# Patient Record
Sex: Female | Born: 1995 | Race: White | Hispanic: No | Marital: Single | Smoking: Never smoker
Health system: Southern US, Community
[De-identification: ages and names within clinical notes are randomized; demographics above are authoritative.]

## PROBLEM LIST (undated history)

## (undated) DIAGNOSIS — J069 Acute upper respiratory infection, unspecified: Secondary | ICD-10-CM

## (undated) DIAGNOSIS — F32A Depression, unspecified: Secondary | ICD-10-CM

## (undated) DIAGNOSIS — J45909 Unspecified asthma, uncomplicated: Secondary | ICD-10-CM

## (undated) DIAGNOSIS — K219 Gastro-esophageal reflux disease without esophagitis: Secondary | ICD-10-CM

## (undated) DIAGNOSIS — R51 Headache: Secondary | ICD-10-CM

## (undated) DIAGNOSIS — L509 Urticaria, unspecified: Secondary | ICD-10-CM

## (undated) DIAGNOSIS — D849 Immunodeficiency, unspecified: Secondary | ICD-10-CM

## (undated) DIAGNOSIS — D8489 Other immunodeficiencies: Secondary | ICD-10-CM

## (undated) DIAGNOSIS — J42 Unspecified chronic bronchitis: Secondary | ICD-10-CM

## (undated) DIAGNOSIS — T7840XA Allergy, unspecified, initial encounter: Secondary | ICD-10-CM

## (undated) DIAGNOSIS — R519 Headache, unspecified: Secondary | ICD-10-CM

## (undated) DIAGNOSIS — F329 Major depressive disorder, single episode, unspecified: Secondary | ICD-10-CM

## (undated) HISTORY — DX: Unspecified chronic bronchitis: J42

## (undated) HISTORY — DX: Headache, unspecified: R51.9

## (undated) HISTORY — DX: Gastro-esophageal reflux disease without esophagitis: K21.9

## (undated) HISTORY — DX: Allergy, unspecified, initial encounter: T78.40XA

## (undated) HISTORY — PX: FOOT SURGERY: SHX648

## (undated) HISTORY — DX: Urticaria, unspecified: L50.9

## (undated) HISTORY — DX: Headache: R51

## (undated) HISTORY — DX: Depression, unspecified: F32.A

## (undated) HISTORY — DX: Acute upper respiratory infection, unspecified: J06.9

## (undated) HISTORY — DX: Unspecified asthma, uncomplicated: J45.909

## (undated) HISTORY — PX: HIP SURGERY: SHX245

## (undated) HISTORY — DX: Major depressive disorder, single episode, unspecified: F32.9

---

## 2017-07-29 DIAGNOSIS — J45909 Unspecified asthma, uncomplicated: Secondary | ICD-10-CM | POA: Diagnosis not present

## 2017-07-29 DIAGNOSIS — D806 Antibody deficiency with near-normal immunoglobulins or with hyperimmunoglobulinemia: Secondary | ICD-10-CM | POA: Diagnosis not present

## 2017-07-29 DIAGNOSIS — D803 Selective deficiency of immunoglobulin G [IgG] subclasses: Secondary | ICD-10-CM | POA: Diagnosis not present

## 2017-08-05 DIAGNOSIS — J101 Influenza due to other identified influenza virus with other respiratory manifestations: Secondary | ICD-10-CM | POA: Diagnosis not present

## 2017-08-05 DIAGNOSIS — J09X2 Influenza due to identified novel influenza A virus with other respiratory manifestations: Secondary | ICD-10-CM | POA: Diagnosis not present

## 2017-08-05 DIAGNOSIS — J45909 Unspecified asthma, uncomplicated: Secondary | ICD-10-CM | POA: Diagnosis not present

## 2017-08-05 DIAGNOSIS — R05 Cough: Secondary | ICD-10-CM | POA: Diagnosis not present

## 2017-08-05 DIAGNOSIS — G43909 Migraine, unspecified, not intractable, without status migrainosus: Secondary | ICD-10-CM | POA: Diagnosis not present

## 2017-08-05 DIAGNOSIS — R0602 Shortness of breath: Secondary | ICD-10-CM | POA: Diagnosis not present

## 2017-09-04 DIAGNOSIS — D806 Antibody deficiency with near-normal immunoglobulins or with hyperimmunoglobulinemia: Secondary | ICD-10-CM | POA: Diagnosis not present

## 2017-09-04 DIAGNOSIS — D803 Selective deficiency of immunoglobulin G [IgG] subclasses: Secondary | ICD-10-CM | POA: Diagnosis not present

## 2017-09-11 DIAGNOSIS — D806 Antibody deficiency with near-normal immunoglobulins or with hyperimmunoglobulinemia: Secondary | ICD-10-CM | POA: Diagnosis not present

## 2017-09-11 DIAGNOSIS — D803 Selective deficiency of immunoglobulin G [IgG] subclasses: Secondary | ICD-10-CM | POA: Diagnosis not present

## 2017-10-01 DIAGNOSIS — M6281 Muscle weakness (generalized): Secondary | ICD-10-CM | POA: Diagnosis not present

## 2017-10-01 DIAGNOSIS — M25651 Stiffness of right hip, not elsewhere classified: Secondary | ICD-10-CM | POA: Diagnosis not present

## 2017-10-01 DIAGNOSIS — M25551 Pain in right hip: Secondary | ICD-10-CM | POA: Diagnosis not present

## 2017-10-20 DIAGNOSIS — M6281 Muscle weakness (generalized): Secondary | ICD-10-CM | POA: Diagnosis not present

## 2017-10-20 DIAGNOSIS — M25551 Pain in right hip: Secondary | ICD-10-CM | POA: Diagnosis not present

## 2017-10-20 DIAGNOSIS — M25651 Stiffness of right hip, not elsewhere classified: Secondary | ICD-10-CM | POA: Diagnosis not present

## 2017-10-27 DIAGNOSIS — M25551 Pain in right hip: Secondary | ICD-10-CM | POA: Diagnosis not present

## 2017-10-27 DIAGNOSIS — M6281 Muscle weakness (generalized): Secondary | ICD-10-CM | POA: Diagnosis not present

## 2017-10-27 DIAGNOSIS — M25651 Stiffness of right hip, not elsewhere classified: Secondary | ICD-10-CM | POA: Diagnosis not present

## 2017-10-29 DIAGNOSIS — M25551 Pain in right hip: Secondary | ICD-10-CM | POA: Diagnosis not present

## 2017-10-29 DIAGNOSIS — M6281 Muscle weakness (generalized): Secondary | ICD-10-CM | POA: Diagnosis not present

## 2017-10-29 DIAGNOSIS — M25651 Stiffness of right hip, not elsewhere classified: Secondary | ICD-10-CM | POA: Diagnosis not present

## 2017-11-05 DIAGNOSIS — M25551 Pain in right hip: Secondary | ICD-10-CM | POA: Diagnosis not present

## 2017-11-05 DIAGNOSIS — M25552 Pain in left hip: Secondary | ICD-10-CM | POA: Diagnosis not present

## 2017-11-11 DIAGNOSIS — D803 Selective deficiency of immunoglobulin G [IgG] subclasses: Secondary | ICD-10-CM | POA: Diagnosis not present

## 2017-11-11 DIAGNOSIS — D806 Antibody deficiency with near-normal immunoglobulins or with hyperimmunoglobulinemia: Secondary | ICD-10-CM | POA: Diagnosis not present

## 2017-12-01 DIAGNOSIS — R87610 Atypical squamous cells of undetermined significance on cytologic smear of cervix (ASC-US): Secondary | ICD-10-CM | POA: Diagnosis not present

## 2017-12-01 DIAGNOSIS — Z01419 Encounter for gynecological examination (general) (routine) without abnormal findings: Secondary | ICD-10-CM | POA: Diagnosis not present

## 2017-12-01 DIAGNOSIS — Z30015 Encounter for initial prescription of vaginal ring hormonal contraceptive: Secondary | ICD-10-CM | POA: Diagnosis not present

## 2017-12-18 DIAGNOSIS — B349 Viral infection, unspecified: Secondary | ICD-10-CM | POA: Diagnosis not present

## 2017-12-30 DIAGNOSIS — M25551 Pain in right hip: Secondary | ICD-10-CM | POA: Diagnosis not present

## 2017-12-30 DIAGNOSIS — M25552 Pain in left hip: Secondary | ICD-10-CM | POA: Diagnosis not present

## 2018-01-11 DIAGNOSIS — D806 Antibody deficiency with near-normal immunoglobulins or with hyperimmunoglobulinemia: Secondary | ICD-10-CM | POA: Diagnosis not present

## 2018-01-11 DIAGNOSIS — D803 Selective deficiency of immunoglobulin G [IgG] subclasses: Secondary | ICD-10-CM | POA: Diagnosis not present

## 2018-01-15 DIAGNOSIS — L500 Allergic urticaria: Secondary | ICD-10-CM | POA: Diagnosis not present

## 2018-01-27 DIAGNOSIS — J019 Acute sinusitis, unspecified: Secondary | ICD-10-CM | POA: Diagnosis not present

## 2018-01-29 DIAGNOSIS — M25552 Pain in left hip: Secondary | ICD-10-CM | POA: Diagnosis not present

## 2018-01-29 DIAGNOSIS — M25551 Pain in right hip: Secondary | ICD-10-CM | POA: Diagnosis not present

## 2018-01-29 DIAGNOSIS — M25851 Other specified joint disorders, right hip: Secondary | ICD-10-CM | POA: Diagnosis not present

## 2018-02-02 DIAGNOSIS — Z9889 Other specified postprocedural states: Secondary | ICD-10-CM | POA: Diagnosis not present

## 2018-02-02 DIAGNOSIS — M25551 Pain in right hip: Secondary | ICD-10-CM | POA: Diagnosis not present

## 2018-02-12 DIAGNOSIS — M25852 Other specified joint disorders, left hip: Secondary | ICD-10-CM | POA: Diagnosis not present

## 2018-02-12 DIAGNOSIS — M25851 Other specified joint disorders, right hip: Secondary | ICD-10-CM | POA: Diagnosis not present

## 2018-02-12 DIAGNOSIS — Z9889 Other specified postprocedural states: Secondary | ICD-10-CM | POA: Diagnosis not present

## 2018-02-16 DIAGNOSIS — Z3202 Encounter for pregnancy test, result negative: Secondary | ICD-10-CM | POA: Diagnosis not present

## 2018-03-09 DIAGNOSIS — N760 Acute vaginitis: Secondary | ICD-10-CM | POA: Diagnosis not present

## 2018-05-05 ENCOUNTER — Encounter (HOSPITAL_COMMUNITY): Payer: Self-pay | Admitting: Emergency Medicine

## 2018-05-05 ENCOUNTER — Inpatient Hospital Stay (HOSPITAL_COMMUNITY)
Admission: EM | Admit: 2018-05-05 | Discharge: 2018-05-10 | DRG: 098 | Disposition: A | Discharge status: Home / Self Care | Payer: 59 | Attending: Internal Medicine | Admitting: Internal Medicine

## 2018-05-05 ENCOUNTER — Other Ambulatory Visit: Payer: Self-pay

## 2018-05-05 DIAGNOSIS — I959 Hypotension, unspecified: Secondary | ICD-10-CM | POA: Diagnosis not present

## 2018-05-05 DIAGNOSIS — R519 Headache, unspecified: Secondary | ICD-10-CM

## 2018-05-05 DIAGNOSIS — D849 Immunodeficiency, unspecified: Secondary | ICD-10-CM | POA: Diagnosis not present

## 2018-05-05 DIAGNOSIS — D800 Hereditary hypogammaglobulinemia: Secondary | ICD-10-CM | POA: Diagnosis present

## 2018-05-05 DIAGNOSIS — R6889 Other general symptoms and signs: Secondary | ICD-10-CM

## 2018-05-05 DIAGNOSIS — Z833 Family history of diabetes mellitus: Secondary | ICD-10-CM

## 2018-05-05 DIAGNOSIS — R51 Headache: Secondary | ICD-10-CM | POA: Diagnosis not present

## 2018-05-05 DIAGNOSIS — Z8249 Family history of ischemic heart disease and other diseases of the circulatory system: Secondary | ICD-10-CM

## 2018-05-05 DIAGNOSIS — G030 Nonpyogenic meningitis: Secondary | ICD-10-CM | POA: Diagnosis not present

## 2018-05-05 DIAGNOSIS — D8489 Other immunodeficiencies: Secondary | ICD-10-CM

## 2018-05-05 DIAGNOSIS — M25551 Pain in right hip: Secondary | ICD-10-CM | POA: Diagnosis present

## 2018-05-05 DIAGNOSIS — Z23 Encounter for immunization: Secondary | ICD-10-CM

## 2018-05-05 DIAGNOSIS — R6510 Systemic inflammatory response syndrome (SIRS) of non-infectious origin without acute organ dysfunction: Secondary | ICD-10-CM | POA: Diagnosis present

## 2018-05-05 DIAGNOSIS — Z880 Allergy status to penicillin: Secondary | ICD-10-CM

## 2018-05-05 DIAGNOSIS — H53149 Visual discomfort, unspecified: Secondary | ICD-10-CM

## 2018-05-05 DIAGNOSIS — G8929 Other chronic pain: Secondary | ICD-10-CM | POA: Diagnosis present

## 2018-05-05 DIAGNOSIS — G43909 Migraine, unspecified, not intractable, without status migrainosus: Secondary | ICD-10-CM | POA: Diagnosis present

## 2018-05-05 DIAGNOSIS — E876 Hypokalemia: Secondary | ICD-10-CM | POA: Diagnosis present

## 2018-05-05 DIAGNOSIS — D848 Other specified immunodeficiencies: Secondary | ICD-10-CM | POA: Diagnosis present

## 2018-05-05 DIAGNOSIS — J45909 Unspecified asthma, uncomplicated: Secondary | ICD-10-CM | POA: Diagnosis present

## 2018-05-05 HISTORY — DX: Other immunodeficiencies: D84.89

## 2018-05-05 HISTORY — DX: Immunodeficiency, unspecified: D84.9

## 2018-05-05 LAB — URINALYSIS, ROUTINE W REFLEX MICROSCOPIC
BILIRUBIN URINE: NEGATIVE
GLUCOSE, UA: NEGATIVE mg/dL
Ketones, ur: 20 mg/dL — AB
NITRITE: NEGATIVE
PH: 5 (ref 5.0–8.0)
Protein, ur: NEGATIVE mg/dL
SPECIFIC GRAVITY, URINE: 1.018 (ref 1.005–1.030)

## 2018-05-05 LAB — COMPREHENSIVE METABOLIC PANEL
ALT: 12 U/L (ref 0–44)
ANION GAP: 10 (ref 5–15)
AST: 14 U/L — AB (ref 15–41)
Albumin: 3.9 g/dL (ref 3.5–5.0)
Alkaline Phosphatase: 46 U/L (ref 38–126)
BUN: 7 mg/dL (ref 6–20)
CHLORIDE: 103 mmol/L (ref 98–111)
CO2: 24 mmol/L (ref 22–32)
Calcium: 9 mg/dL (ref 8.9–10.3)
Creatinine, Ser: 0.67 mg/dL (ref 0.44–1.00)
GFR calc Af Amer: >60 mL/min (ref >60)
Glucose, Bld: 92 mg/dL (ref 70–99)
POTASSIUM: 3.6 mmol/L (ref 3.5–5.1)
Sodium: 137 mmol/L (ref 135–145)
Total Bilirubin: 0.7 mg/dL (ref 0.3–1.2)
Total Protein: 7.8 g/dL (ref 6.5–8.1)

## 2018-05-05 LAB — CSF CELL COUNT WITH DIFFERENTIAL
RBC COUNT CSF: 5000 /mm3 — AB
TUBE #: 1
WBC, CSF: 10 /mm3 — ABNORMAL HIGH (ref 0–5)

## 2018-05-05 LAB — CBC WITH DIFFERENTIAL/PLATELET
Abs Immature Granulocytes: 0.14 10*3/uL — ABNORMAL HIGH (ref 0.00–0.07)
BASOS ABS: 0.1 10*3/uL (ref 0.0–0.1)
BASOS PCT: 0 %
EOS ABS: 0 10*3/uL (ref 0.0–0.5)
EOS PCT: 0 %
HCT: 38.9 % (ref 36.0–46.0)
Hemoglobin: 12.7 g/dL (ref 12.0–15.0)
IMMATURE GRANULOCYTES: 1 %
Lymphocytes Relative: 4 %
Lymphs Abs: 0.8 10*3/uL (ref 0.7–4.0)
MCH: 28 pg (ref 26.0–34.0)
MCHC: 32.6 g/dL (ref 30.0–36.0)
MCV: 85.7 fL (ref 80.0–100.0)
Monocytes Absolute: 0.9 10*3/uL (ref 0.1–1.0)
Monocytes Relative: 4 %
NEUTROS PCT: 91 %
NRBC: 0.2 % (ref 0.0–0.2)
Neutro Abs: 18.8 10*3/uL — ABNORMAL HIGH (ref 1.7–7.7)
PLATELETS: 266 10*3/uL (ref 150–400)
RBC: 4.54 MIL/uL (ref 3.87–5.11)
RDW: 12.1 % (ref 11.5–15.5)
WBC: 20.7 10*3/uL — ABNORMAL HIGH (ref 4.0–10.5)

## 2018-05-05 LAB — I-STAT CG4 LACTIC ACID, ED
LACTIC ACID, VENOUS: 1.03 mmol/L (ref 0.5–1.9)
LACTIC ACID, VENOUS: 1.9 mmol/L (ref 0.5–1.9)

## 2018-05-05 LAB — I-STAT BETA HCG BLOOD, ED (MC, WL, AP ONLY): I-stat hCG, quantitative: <5 m[IU]/mL (ref <5)

## 2018-05-05 LAB — PROTEIN, CSF: Total  Protein, CSF: 21 mg/dL (ref 15–45)

## 2018-05-05 LAB — INFLUENZA PANEL BY PCR (TYPE A & B)
Influenza A By PCR: NEGATIVE
Influenza B By PCR: NEGATIVE

## 2018-05-05 LAB — GLUCOSE, CSF: GLUCOSE CSF: 54 mg/dL (ref 40–70)

## 2018-05-05 MED ORDER — DIPHENHYDRAMINE HCL 50 MG/ML IJ SOLN
25.0000 mg | Freq: Once | INTRAMUSCULAR | Status: AC
  Administered 2018-05-05: 25 mg via INTRAVENOUS
  Filled 2018-05-05: qty 1

## 2018-05-05 MED ORDER — LIDOCAINE HCL (PF) 1 % IJ SOLN
5.0000 mL | Freq: Once | INTRAMUSCULAR | Status: AC
  Administered 2018-05-05: 5 mL
  Filled 2018-05-05: qty 30

## 2018-05-05 MED ORDER — VANCOMYCIN HCL IN DEXTROSE 1-5 GM/200ML-% IV SOLN
1000.0000 mg | Freq: Once | INTRAVENOUS | Status: AC
  Administered 2018-05-05: 1000 mg via INTRAVENOUS
  Filled 2018-05-05: qty 200

## 2018-05-05 MED ORDER — SODIUM CHLORIDE 0.9 % IV BOLUS
1000.0000 mL | Freq: Once | INTRAVENOUS | Status: AC
  Administered 2018-05-05: 1000 mL via INTRAVENOUS

## 2018-05-05 MED ORDER — KETOROLAC TROMETHAMINE 30 MG/ML IJ SOLN
30.0000 mg | Freq: Once | INTRAMUSCULAR | Status: AC
  Administered 2018-05-05: 30 mg via INTRAVENOUS
  Filled 2018-05-05: qty 1

## 2018-05-05 MED ORDER — SODIUM CHLORIDE 0.9 % IV SOLN
1000.0000 mL | INTRAVENOUS | Status: DC
  Administered 2018-05-05: 1000 mL via INTRAVENOUS

## 2018-05-05 MED ORDER — SODIUM CHLORIDE 0.9 % IV BOLUS (SEPSIS)
1000.0000 mL | Freq: Once | INTRAVENOUS | Status: AC
  Administered 2018-05-05: 1000 mL via INTRAVENOUS

## 2018-05-05 MED ORDER — METOCLOPRAMIDE HCL 5 MG/ML IJ SOLN
10.0000 mg | Freq: Once | INTRAMUSCULAR | Status: AC
  Administered 2018-05-05: 10 mg via INTRAVENOUS
  Filled 2018-05-05: qty 2

## 2018-05-05 MED ORDER — SULFAMETHOXAZOLE-TRIMETHOPRIM 400-80 MG/5ML IV SOLN
300.0000 mg | Freq: Once | INTRAVENOUS | Status: AC
  Administered 2018-05-05: 300 mg via INTRAVENOUS
  Filled 2018-05-05: qty 18.75

## 2018-05-05 NOTE — ED Notes (Signed)
Pt ambulated self to bathroom.  

## 2018-05-05 NOTE — ED Triage Notes (Signed)
Patient c/o headache, sore throat, neck pain, body aches x3 days. Denies N/V/D.

## 2018-05-05 NOTE — ED Provider Notes (Signed)
MSE was initiated and I personally evaluated the patient and placed orders (if any) at  5:45 PM on May 05, 2018.  Patient with past medical history of primary immune deficiency disorder, presenting to the emergency department with complaint of 3 days of severe headache, generalized myalgias, and upper respiratory symptoms.  She states symptoms began on Monday with headache.  Ports associated photophobia and seeing floaters.  Denies nausea or vomiting.  Today she developed dry cough, sore throat, bilateral ear pain.  She also reports decreased appetite and generalized body aches.  States her neck feels stiff.  Regarding her history of immunodeficiency, she recently was receiving IgG infusions, last time in June and has felt well and is not had recent infusion since that time.  States she does not normally have fevers with illnesses though today she had a temperature of 100.1.  Patient slightly tachycardic on evaluation.  Lungs clear.  ENT exam unremarkable. Normal neuro exam. Pt with pain with ROM of neck, though no obvious nuchal rigidity.  Patient discussed with Dr. Donnald Garre.  Do not feel this patient is fast-track appropriate and will be moved to the acute side of the ED.  Labs ordered.  Patient is stable at this time.   The patient appears stable so that the remainder of the MSE may be completed by another provider.   Robinson, [place] N, PA-C 05/05/18 1749    Arby Barrette, MD 05/06/18 [phone]

## 2018-05-05 NOTE — ED Provider Notes (Signed)
[facility]-EMERGENCY DEPT Provider Note   CSN: [id number] Arrival date & time: 05/05/18  1557     History   Chief Complaint Chief Complaint  Patient presents with  . Sore Throat  . Generalized Body Aches    HPI Melinda Gray is a 22 y.o. female.  HPI Patient reports that she went to a bachelor party in [place]. [place].  She got there last Thursday (6 days ago.)  She got home Monday (3 days ago).  On that day she started getting generalized headache.  She reports the headache is fairly severe.  She continued on to get sore throat, body aches, neck stiffness and generalized fatigue.  Reports today she developed a fever up to 101.  Patient denies any tick bites.  She reports she does not spend much time outside.  No known sick exposure but she reports when she was in [place] she was exposed to many people.  Patient has known history of primary immune deficiency. Past Medical History:  Diagnosis Date  . Primary immune deficiency disorder [facility])     Patient Active Problem List   Diagnosis Date Noted  . SIRS (systemic inflammatory response syndrome) (HCC) 05/05/2018       OB History   None      Home Medications    Prior to Admission medications   Medication Sig Start Date End Date Taking? Authorizing Provider  naproxen sodium (PAMPRIN ALL DAY RELIEF MAX ST) 220 MG tablet Take 220 mg by mouth 2 (two) times daily as needed (headache).   Yes [provider]  NUVARING 0.12-0.015 MG/24HR vaginal ring Place 1 each vaginally every 28 (twenty-eight) days.  04/22/18  Yes [provider]    Family History No family history on file.  Social History Social History   Tobacco Use  . Smoking status: Not on file  Substance Use Topics  . Alcohol use: Not on file  . Drug use: Not on file     Allergies   Amoxicillin and Augmentin [amoxicillin-pot clavulanate]   Review of Systems Review of Systems 10 Systems reviewed and are negative for  acute change except as noted in the HPI.   Physical Exam Updated Vital Signs BP (!) 103/58   Pulse (!) 116   Temp 99.3 F (37.4 C) (Oral)   Resp 15   Ht 5\' 5"  (1.651 m)   Wt 61.2 kg   LMP 04/05/2018   SpO2 100%   BMI 22.47 kg/m   Physical Exam  Constitutional: She is oriented to person, place, and time.  Patient is alert and nontoxic.  Her mental status is clear.  Respiratory status normal.  HENT:  Head: Normocephalic and atraumatic.  His membranes are pink and moist.  Throat is widely patent.  No erythema or exudate on the tonsillar pillars.  Eyes: Pupils are equal, round, and reactive to light. EOM are normal.  Neck: Neck supple.  Patient has intact flexion of the neck but reports it feels stiff when she does bend her head forward.  Cardiovascular:  Borderline tachycardia.  No rub murmur gallop.  Pulmonary/Chest: Effort normal and breath sounds normal.  Abdominal: Soft. She exhibits no distension. There is no tenderness. There is no guarding.  Musculoskeletal: Normal range of motion. She exhibits no edema or tenderness.  Condition of extremities is excellent.  Skin condition very good.  There is no peripheral edema.  There is no rashes lesions or wounds to the hands arms feet or lower legs.  Neurological:  She is alert and oriented to person, place, and time. No cranial nerve deficit. She exhibits normal muscle tone. Coordination normal.  Skin: Skin is warm and dry. No rash noted.  Psychiatric: She has a normal mood and affect.     ED Treatments / Results  Labs (all labs ordered are listed, but only abnormal results are displayed) Labs Reviewed  CBC WITH DIFFERENTIAL/PLATELET - Abnormal; Notable for the following components:      Result Value   WBC 20.7 (*)    Neutro Abs 18.8 (*)    Abs Immature Granulocytes 0.14 (*)    All other components within normal limits  COMPREHENSIVE METABOLIC PANEL - Abnormal; Notable for the following components:   AST 14 (*)    All other  components within normal limits  URINALYSIS, ROUTINE W REFLEX MICROSCOPIC - Abnormal; Notable for the following components:   APPearance HAZY (*)    Hgb urine dipstick LARGE (*)    Ketones, ur 20 (*)    Leukocytes, UA TRACE (*)    Bacteria, UA FEW (*)    All other components within normal limits  CSF CELL COUNT WITH DIFFERENTIAL - Abnormal; Notable for the following components:   Appearance, CSF HAZY (*)    RBC Count, CSF 5,000 (*)    WBC, CSF 10 (*)    All other components within normal limits  CULTURE, BLOOD (ROUTINE X 2)  CULTURE, BLOOD (ROUTINE X 2)  URINE CULTURE  CSF CULTURE  INFLUENZA PANEL BY PCR (TYPE A & B)  GLUCOSE, CSF  PROTEIN, CSF  CSF CELL COUNT WITH DIFFERENTIAL  I-STAT CG4 LACTIC ACID, ED  I-STAT BETA HCG BLOOD, ED ([facility], [facility], [facility] ONLY)  I-STAT CG4 LACTIC ACID, ED    EKG None  Radiology No results found.  Procedure  CRITICAL CARE Performed by: Arby Barrette   Total critical care time: 30 minutes  Critical care time was exclusive of separately billable procedures and treating other patients.  Critical care was necessary to treat or prevent imminent or life-threatening deterioration.  Critical care was time spent personally by me on the following activities: development of treatment plan with patient and/or surrogate as well as nursing, discussions with consultants, evaluation of patient's response to treatment, examination of patient, obtaining history from patient or surrogate, ordering and performing treatments and interventions, ordering and review of laboratory studies, ordering and review of radiographic studies, pulse oximetry and re-evaluation of patient's condition. .Lumbar Puncture Date/Time: 05/05/2018 8:31 PM Performed by: Arby Barrette, MD Authorized by: Arby Barrette, MD   Consent:    Consent obtained:  Verbal   Consent given by:  Patient   Risks discussed:  Bleeding, infection, pain, repeat procedure, nerve damage and headache    Alternatives discussed:  Delayed treatment Pre-procedure details:    Procedure purpose:  Diagnostic   Preparation: Patient was prepped and draped in usual sterile fashion   Anesthesia (see MAR for exact dosages):    Anesthesia method:  Local infiltration   Local anesthetic:  Lidocaine 1% w/o epi Procedure details:    Lumbar space:  L4-L5 interspace   Patient position:  Sitting   Needle gauge:  20   Needle type:  Spinal needle - Quincke tip   Needle length (in):  3.5   Number of attempts:  3   Fluid appearance:  Blood-tinged then clearing   Tubes of fluid:  4   Total volume (ml):  4 Post-procedure:    Puncture site:  Adhesive bandage applied   Patient  tolerance of procedure:  Tolerated well, no immediate complications   (including critical care time)  Medications Ordered in ED Medications  sodium chloride 0.9 % bolus 1,000 mL (0 mLs Intravenous Stopped 05/05/18 2202)    Followed by  0.9 %  sodium chloride infusion (1,000 mLs Intravenous New Bag/Given 05/05/18 1947)  ketorolac (TORADOL) 30 MG/ML injection 30 mg (30 mg Intravenous Given 05/05/18 1943)  sulfamethoxazole-trimethoprim (BACTRIM) 300 mg in dextrose 5 % 250 mL IVPB (0 mg Intravenous Stopped 05/05/18 2202)  vancomycin (VANCOCIN) IVPB 1000 mg/200 mL premix (0 mg Intravenous Stopped 05/05/18 2242)  lidocaine (PF) (XYLOCAINE) 1 % injection 5 mL (5 mLs Infiltration Given 05/05/18 1954)  sodium chloride 0.9 % bolus 1,000 mL (0 mLs Intravenous Stopped 05/05/18 2203)  metoCLOPramide (REGLAN) injection 10 mg (10 mg Intravenous Given 05/05/18 2040)  diphenhydrAMINE (BENADRYL) injection 25 mg (25 mg Intravenous Given 05/05/18 2040)     Initial Impression / Assessment and Plan / ED Course  I have reviewed the triage vital signs and the nursing notes.  Pertinent labs & imaging results that were available during my care of the patient were reviewed by me and considered in my medical decision making (see chart for  details).  Clinical Course as of May 06 2351  Wed May 05, 2018  2317 Patient has been up and ambulating about the emergency department to go to the bathroom since having her lumbar puncture.  She is alert and pleasant.  No signs of confusion or lethargy.   [MP]  2350 Consult: Reviewed with Dr. Toniann Fail for admission.   [MP]    Clinical Course User Index [MP] Arby Barrette, MD   Patient presents with constellation of symptoms of fever at home, headache, photophobia, reported neck stiffness and general flulike symptoms.  Influenza is negative.  Patient does have reportedly primary immune deficiency disorder.  Clinically, the patient looks well.  She is alert and nontoxic.  No somnolence or lethargy.  Patient has been up and ambulatory without difficulty.  No signs of being encephalopathic.  Patient does have leukocytosis to 20,000.  She has been empirically started on antibiotics for possible meningitis.  She reports allergy to amoxicillin and Augmentin reporting that she got full body hives.  Patient will need observation with empiric antibiotics until first set of cultures return.  Plan for admission.   Final Clinical Impressions(s) / ED Diagnoses   Final diagnoses:  Acute nonintractable headache, unspecified headache type  Photophobia  Flu-like symptoms  Primary immune deficiency disorder [facility])    ED Discharge Orders    None       Arby Barrette, MD 05/07/18 1536

## 2018-05-05 NOTE — ED Notes (Signed)
Bed: WA09 Expected date:  Expected time:  Means of arrival:  Comments: [place]

## 2018-05-06 ENCOUNTER — Other Ambulatory Visit: Payer: Self-pay

## 2018-05-06 ENCOUNTER — Encounter (HOSPITAL_COMMUNITY): Payer: Self-pay

## 2018-05-06 DIAGNOSIS — Z881 Allergy status to other antibiotic agents status: Secondary | ICD-10-CM | POA: Diagnosis not present

## 2018-05-06 DIAGNOSIS — J45909 Unspecified asthma, uncomplicated: Secondary | ICD-10-CM

## 2018-05-06 DIAGNOSIS — D849 Immunodeficiency, unspecified: Secondary | ICD-10-CM | POA: Diagnosis not present

## 2018-05-06 DIAGNOSIS — R6510 Systemic inflammatory response syndrome (SIRS) of non-infectious origin without acute organ dysfunction: Secondary | ICD-10-CM

## 2018-05-06 DIAGNOSIS — D801 Nonfamilial hypogammaglobulinemia: Secondary | ICD-10-CM

## 2018-05-06 DIAGNOSIS — G039 Meningitis, unspecified: Secondary | ICD-10-CM | POA: Diagnosis not present

## 2018-05-06 DIAGNOSIS — D8489 Other immunodeficiencies: Secondary | ICD-10-CM

## 2018-05-06 LAB — BASIC METABOLIC PANEL
ANION GAP: 6 (ref 5–15)
BUN: 6 mg/dL (ref 6–20)
CO2: 21 mmol/L — ABNORMAL LOW (ref 22–32)
Calcium: 7.7 mg/dL — ABNORMAL LOW (ref 8.9–10.3)
Chloride: 113 mmol/L — ABNORMAL HIGH (ref 98–111)
Creatinine, Ser: 0.75 mg/dL (ref 0.44–1.00)
GFR calc Af Amer: >60 mL/min (ref >60)
GFR calc non Af Amer: >60 mL/min (ref >60)
GLUCOSE: 100 mg/dL — AB (ref 70–99)
POTASSIUM: 3.2 mmol/L — AB (ref 3.5–5.1)
SODIUM: 140 mmol/L (ref 135–145)

## 2018-05-06 LAB — CBC WITH DIFFERENTIAL/PLATELET
ABS IMMATURE GRANULOCYTES: 0.05 10*3/uL (ref 0.00–0.07)
BASOS ABS: 0.1 10*3/uL (ref 0.0–0.1)
BASOS PCT: 0 %
Eosinophils Absolute: 0.1 10*3/uL (ref 0.0–0.5)
Eosinophils Relative: 1 %
HCT: 29.9 % — ABNORMAL LOW (ref 36.0–46.0)
HEMOGLOBIN: 9.7 g/dL — AB (ref 12.0–15.0)
Immature Granulocytes: 0 %
LYMPHS PCT: 11 %
Lymphs Abs: 1.4 10*3/uL (ref 0.7–4.0)
MCH: 28 pg (ref 26.0–34.0)
MCHC: 32.4 g/dL (ref 30.0–36.0)
MCV: 86.2 fL (ref 80.0–100.0)
Monocytes Absolute: 0.7 10*3/uL (ref 0.1–1.0)
Monocytes Relative: 6 %
NEUTROS ABS: 9.8 10*3/uL — AB (ref 1.7–7.7)
NEUTROS PCT: 82 %
NRBC: 0 % (ref 0.0–0.2)
PLATELETS: 213 10*3/uL (ref 150–400)
RBC: 3.47 MIL/uL — AB (ref 3.87–5.11)
RDW: 12.4 % (ref 11.5–15.5)
WBC: 12.1 10*3/uL — AB (ref 4.0–10.5)

## 2018-05-06 LAB — CSF CELL COUNT WITH DIFFERENTIAL
RBC Count, CSF: 29 /mm3 — ABNORMAL HIGH
Tube #: 4
WBC CSF: 6 /mm3 — AB (ref 0–5)

## 2018-05-06 LAB — HIV ANTIBODY (ROUTINE TESTING W REFLEX): HIV SCREEN 4TH GENERATION: NONREACTIVE

## 2018-05-06 LAB — CRYPTOCOCCAL ANTIGEN
Crypto Ag: NEGATIVE
Cryptococcal Ag Titer: NEGATIVE — AB

## 2018-05-06 MED ORDER — VANCOMYCIN HCL IN DEXTROSE 1-5 GM/200ML-% IV SOLN
1000.0000 mg | Freq: Two times a day (BID) | INTRAVENOUS | Status: DC
  Administered 2018-05-06 – 2018-05-09 (×7): 1000 mg via INTRAVENOUS
  Filled 2018-05-06 (×6): qty 200

## 2018-05-06 MED ORDER — ONDANSETRON HCL 4 MG PO TABS
4.0000 mg | ORAL_TABLET | Freq: Four times a day (QID) | ORAL | Status: DC | PRN
  Administered 2018-05-10: 4 mg via ORAL
  Filled 2018-05-06: qty 1

## 2018-05-06 MED ORDER — ONDANSETRON HCL 4 MG/2ML IJ SOLN: 4.0000 mg | Freq: Four times a day (QID) | INTRAMUSCULAR | Status: DC | PRN

## 2018-05-06 MED ORDER — DEXTROSE 5 % IV SOLN
600.0000 mg | Freq: Once | INTRAVENOUS | Status: AC
  Administered 2018-05-06: 600 mg via INTRAVENOUS
  Filled 2018-05-06: qty 12

## 2018-05-06 MED ORDER — SODIUM CHLORIDE 0.9 % IV SOLN
2.0000 g | Freq: Three times a day (TID) | INTRAVENOUS | Status: DC
  Administered 2018-05-06 – 2018-05-09 (×8): 2 g via INTRAVENOUS
  Filled 2018-05-06 (×10): qty 2

## 2018-05-06 MED ORDER — INFLUENZA VAC SPLIT QUAD 0.5 ML IM SUSY
0.5000 mL | PREFILLED_SYRINGE | INTRAMUSCULAR | Status: AC
  Administered 2018-05-07: 0.5 mL via INTRAMUSCULAR
  Filled 2018-05-06: qty 0.5

## 2018-05-06 MED ORDER — SODIUM CHLORIDE 0.9 % IV SOLN
INTRAVENOUS | Status: DC
  Administered 2018-05-06 (×2): via INTRAVENOUS

## 2018-05-06 MED ORDER — VALACYCLOVIR HCL 500 MG PO TABS
1000.0000 mg | ORAL_TABLET | Freq: Three times a day (TID) | ORAL | Status: DC
  Administered 2018-05-06 – 2018-05-07 (×3): 1000 mg via ORAL
  Filled 2018-05-06 (×4): qty 2

## 2018-05-06 MED ORDER — ACETAMINOPHEN 650 MG RE SUPP: 650.0000 mg | Freq: Four times a day (QID) | RECTAL | Status: DC | PRN

## 2018-05-06 MED ORDER — DEXTROSE 5 % IV SOLN
10.0000 mg/kg | Freq: Once | INTRAVENOUS | Status: AC
  Administered 2018-05-06: 610 mg via INTRAVENOUS
  Filled 2018-05-06: qty 12.2

## 2018-05-06 MED ORDER — ACETAMINOPHEN 325 MG PO TABS
650.0000 mg | ORAL_TABLET | Freq: Four times a day (QID) | ORAL | Status: DC | PRN
  Administered 2018-05-06 – 2018-05-10 (×6): 650 mg via ORAL
  Filled 2018-05-06 (×6): qty 2

## 2018-05-06 MED ORDER — POTASSIUM CHLORIDE CRYS ER 20 MEQ PO TBCR
40.0000 meq | EXTENDED_RELEASE_TABLET | Freq: Once | ORAL | Status: AC
  Administered 2018-05-06: 40 meq via ORAL
  Filled 2018-05-06: qty 2

## 2018-05-06 MED ORDER — SULFAMETHOXAZOLE-TRIMETHOPRIM 400-80 MG/5ML IV SOLN
320.0000 mg | Freq: Three times a day (TID) | INTRAVENOUS | Status: DC
  Administered 2018-05-06 (×2): 320 mg via INTRAVENOUS
  Filled 2018-05-06 (×3): qty 20

## 2018-05-06 NOTE — Progress Notes (Signed)
Pharmacy Antibiotic Note  Melinda Gray is a 22 y.o. female admitted on 05/05/2018 with meningitis.  Pharmacy has been consulted for Bactrim, acyclovir, vancomycin dosing.  Plan: Bactrim IV 320mg  q8hr Acyclovir 610mg  iv x1--MD will get ID input on if to continue due to medication shortage.  Vancomycin 1gm iv q12hr  Goal AUC = 400 - 500 for all indications, except meningitis (goal AUC > 500 and Cmin 15-20 mcg/mL)   Height: 5\' 5"  (165.1 cm) Weight: 135 lb (61.2 kg) IBW/kg (Calculated) : 57  Temp (24hrs), Avg:99.1 F (37.3 C), Min:98.8 F (37.1 C), Max:99.3 F (37.4 C)  Recent Labs  Lab 05/05/18 1803 05/05/18 1823 05/05/18 2245  WBC 20.7*  --   --   CREATININE 0.67  --   --   LATICACIDVEN  --  1.03 1.90    Estimated Creatinine Clearance: 98.4 mL/min (by C-G formula based on SCr of 0.67 mg/dL).    Allergies  Allergen Reactions  . Amoxicillin   . Augmentin [Amoxicillin-Pot Clavulanate]     Antimicrobials this admission: Vancomycin 05/06/2018 >> Bactrim 05/05/2018 >>  Acyclovir 05/06/2018 x1  Dose adjustments this admission: -  Microbiology results: -  Thank you for allowing pharmacy to be a part of this patient's care.  Aleene Davidson Crowford 05/06/2018 5:45 AM

## 2018-05-06 NOTE — Progress Notes (Signed)
Pharmacy Antibiotic Note  Melinda Gray is a 22 y.o. female admitted on 05/05/2018 with meningitis.  Pharmacy has been consulted for Bactrim, acyclovir, vancomycin dosing. On 10/24, ID consult and has changed Bactrim to meropenem per Rx and acyclovir has been d/c'd  05/06/2018:  Afebrile  Mild leukocytosis  Renal function at baseline, est CrCl ~15ml/min  Cx data negative to date  Plan:  Meropenem 2g IV q8 for meningitis and per current renal function  Continue current vancomycin dosing  Monitor renal function and cx data    Height: 5\' 5"  (165.1 cm) Weight: 135 lb (61.2 kg) IBW/kg (Calculated) : 57  Temp (24hrs), Avg:98.9 F (37.2 C), Min:98.6 F (37 C), Max:99.3 F (37.4 C)  Recent Labs  Lab 05/05/18 1803 05/05/18 1823 05/05/18 2245 05/06/18 0556  WBC 20.7*  --   --  12.1*  CREATININE 0.67  --   --  0.75  LATICACIDVEN  --  1.03 1.90  --     Estimated Creatinine Clearance: 98.4 mL/min (by C-G formula based on SCr of 0.75 mg/dL).    Allergies  Allergen Reactions  . Amoxicillin   . Augmentin [Amoxicillin-Pot Clavulanate]      Thank you for allowing pharmacy to be a part of this patient's care.  Berkley Harvey 05/06/2018 2:58 PM

## 2018-05-06 NOTE — H&P (Signed)
History and Physical    Melinda Gray ZOX:[id number] DOB: 1995/10/21 DOA: 05/05/2018  PCP: Patient, No Pcp Per  Patient coming from: Home.  Chief Complaint: Headache fever.  HPI: Melinda Gray is a 22 y.o. female with history of primary immunodeficiency disorder takes alternate weeks of subcutaneous IgG injections and has not taken it since June 2019 who has just recently moved from [place] to [place] presents to the ER because of persistent headache with fever chills and generalized body ache.  Patient symptoms started on Monday 4 days ago after attending a bachelorette party in [place]. [place] over the weekend.  Following which patient started developing headache which was more than her usual migraine headaches and 2 days ago started developing fever chills photophobia sore throat-like symptoms and neck pain.  Denies any productive cough chest pain nausea vomiting or diarrhea.  Despite taking naproxen symptoms did not improve and came to the ER.  ED Course: In the ER patient was mildly febrile with labs showing leukocytosis.  Given the symptoms patient had lumbar puncture done which shows WBC of 10 and RBC of 5000 likely a traumatic tap.  Cultures and Gram stain are pending.  Glucose was 54 and protein was 21.  Given the immunodeficient state patient was started empirically on antibiotics for possible meningitis including vancomycin and Bactrim patient was allergic to penicillin.  Was given Toradol IV fluids following which patient symptoms are improved.  Review of Systems: As per HPI, rest all negative.   Past Medical History:  Diagnosis Date  . Primary immune deficiency disorder [facility])     Past Surgical History:  Procedure Laterality Date  . FOOT SURGERY    . HIP SURGERY       reports that she has never smoked. She has never used smokeless tobacco. She reports that she does not use drugs. Her alcohol history is not on file.  Allergies  Allergen Reactions  . Amoxicillin   .  Augmentin [Amoxicillin-Pot Clavulanate]     Family History  Problem Relation Age of Onset  . Alport syndrome Father   . Diabetes Mellitus II Maternal Grandmother     Prior to Admission medications   Medication Sig Start Date End Date Taking? Authorizing Provider  naproxen sodium (PAMPRIN ALL DAY RELIEF MAX ST) 220 MG tablet Take 220 mg by mouth 2 (two) times daily as needed (headache).   Yes [provider]  NUVARING 0.12-0.015 MG/24HR vaginal ring Place 1 each vaginally every 28 (twenty-eight) days.  04/22/18  Yes [provider]    Physical Exam: Vitals:   05/05/18 2100 05/05/18 2115 05/05/18 2145 05/06/18 0038  BP: (!) 105/56 (!) 104/57 (!) 103/58 (!) 116/56  Pulse: 96 100 (!) 116 94  Resp: (!) 22 17 15 15   Temp:    98.8 F (37.1 C)  TempSrc:    Oral  SpO2: 100% 100% 100% 99%  Weight:      Height:          Constitutional: Moderately built and nourished. Vitals:   05/05/18 2100 05/05/18 2115 05/05/18 2145 05/06/18 0038  BP: (!) 105/56 (!) 104/57 (!) 103/58 (!) 116/56  Pulse: 96 100 (!) 116 94  Resp: (!) 22 17 15 15   Temp:    98.8 F (37.1 C)  TempSrc:    Oral  SpO2: 100% 100% 100% 99%  Weight:      Height:       Eyes: Anicteric no pallor. ENMT: No discharge from the ears eyes nose or  mouth.  No obvious exudates on the tonsils. Neck: No neck rigidity no mass felt.  No JVD appreciated. Respiratory: No rhonchi or crepitations. Cardiovascular: S1-S2 heard no murmurs appreciated. Abdomen: Soft nontender bowel sounds present. Musculoskeletal: No edema.  No joint effusion. Skin: No rash. Neurologic: Alert awake oriented to time place and person.  Moves all extremities. Psychiatric: Appears normal per normal affect.   Labs on Admission: I have personally reviewed following labs and imaging studies  CBC: Recent Labs  Lab 05/05/18 1803  WBC 20.7*  NEUTROABS 18.8*  HGB 12.7  HCT 38.9  MCV 85.7  PLT 266   Basic Metabolic Panel: Recent Labs   Lab 05/05/18 1803  NA 137  K 3.6  CL 103  CO2 24  GLUCOSE 92  BUN 7  CREATININE 0.67  CALCIUM 9.0   GFR: Estimated Creatinine Clearance: 98.4 mL/min (by C-G formula based on SCr of 0.67 mg/dL). Liver Function Tests: Recent Labs  Lab 05/05/18 1803  AST 14*  ALT 12  ALKPHOS 46  BILITOT 0.7  PROT 7.8  ALBUMIN 3.9   No results for input(s): LIPASE, AMYLASE in the last 168 hours. No results for input(s): AMMONIA in the last 168 hours. Coagulation Profile: No results for input(s): INR, PROTIME in the last 168 hours. Cardiac Enzymes: No results for input(s): CKTOTAL, CKMB, CKMBINDEX, TROPONINI in the last 168 hours. BNP (last 3 results) No results for input(s): PROBNP in the last 8760 hours. HbA1C: No results for input(s): HGBA1C in the last 72 hours. CBG: No results for input(s): GLUCAP in the last 168 hours. Lipid Profile: No results for input(s): CHOL, HDL, LDLCALC, TRIG, CHOLHDL, LDLDIRECT in the last 72 hours. Thyroid Function Tests: No results for input(s): TSH, T4TOTAL, FREET4, T3FREE, THYROIDAB in the last 72 hours. Anemia Panel: No results for input(s): VITAMINB12, FOLATE, FERRITIN, TIBC, IRON, RETICCTPCT in the last 72 hours. Urine analysis:    Component Value Date/Time   COLORURINE YELLOW 05/05/2018 1803   APPEARANCEUR HAZY (A) 05/05/2018 1803   LABSPEC 1.018 05/05/2018 1803   PHURINE 5.0 05/05/2018 1803   GLUCOSEU NEGATIVE 05/05/2018 1803   HGBUR LARGE (A) 05/05/2018 1803   BILIRUBINUR NEGATIVE 05/05/2018 1803   KETONESUR 20 (A) 05/05/2018 1803   PROTEINUR NEGATIVE 05/05/2018 1803   NITRITE NEGATIVE 05/05/2018 1803   LEUKOCYTESUR TRACE (A) 05/05/2018 1803   Sepsis Labs: @LABRCNTIP (procalcitonin:4,lacticidven:4) )No results found for this or any previous visit (from the past 240 hour(s)).   Radiological Exams on Admission: No results found.   Assessment/Plan Principal Problem:   SIRS (systemic inflammatory response syndrome) (HCC) Active  Problems:   Primary immune deficiency disorder (HCC)    1. SIRS suspect meningitis likely aseptic -pending Gram stain and cultures.  For now we will continue with empiric antibiotics till final results of lumbar puncture and blood cultures are available.  UA shows possibility of UTI for which urine culture has been sent.  Continue with fluids and symptomatic management along with antibiotics for now.  Acyclovir has been added along with HSV PCR for lumbar puncture. 2. Primary immune deficiency disorder takes IgG injection every other week has not taken for last few months since June 2019.   DVT prophylaxis: SCDs for now since patient just had a lumbar puncture. Code Status: Full code. Family Communication: Discussed with patient. Disposition Plan: Home. Consults called: None. Admission status: Observation.   Eduard Clos MD [facility] Pager [phone].  If 7PM-7AM, please contact night-coverage [email or web address] Password [email or web address]  05/06/2018, 1:55 AM

## 2018-05-06 NOTE — Progress Notes (Addendum)
Patient is a 22 year old female with past medical history of primary immunodeficiency disorder who takes subcutaneous Ig injection every other weeks who presented to the emergency department with complaints of persistent headache, fever and chills along with generalized body ache.  Also reported of photophobia, sore throat and neck pain.  Since she was immunocompromised, there was concern for meningitis versus encephalitis and she was started on broad-spectrum antibiotics.  She also had leukocytosis on presentation.  Lumbar puncture was done.  It was a bloody tap showing 5000 RBCs which corresponds to 10 WBCs.  CSF white cell count was just 10 so there may not be significant white cell count present on CSF itself.  Protein was normal.Culture and Gram stain has been sent.  Gram stain did not show any bacteria but showed both polynuclear and mononuclear cells.  We will follow the culture.    Patient does not complain of any headache or neck pain today.  Examination this morning was benign, there was no neck rigidity. If she remains stable, will discontinue the antibiotics and plan for discharge tomorrow. Patient seen by Dr. Toniann Fail this morning.

## 2018-05-06 NOTE — Consult Note (Addendum)
[facility]    Date of Admission:  05/05/2018   Total days of antibiotics: 1 bactrim/vanco/acycylovir              Reason for Consult: Meningitis    Referring Provider: Tawanna Solo   Assessment: Meningitis Hypogammaglobulinemia  Plan: 1. Ask heme to see her to restart her IgG 2. Change bactrim to merrem 3. Check fungal serologies 4. Change IV acyclovir to valtrex.  5. await her Cx.   Comment- Etiology is unclear. Herpes would be common. We are closing the viral/arbovirus season. Unfortunately we do not have enough CSF to do testing for these etiologies.  She has just returned from [place], which is endemic for Histo. For her to develop this acutely would be unusual but will check serology, and urine Ag.  Her amoxil allergy is "hives, rash, diarrhea".   Thank you so much for this interesting consult,  Principal Problem:   SIRS (systemic inflammatory response syndrome) (HCC) Active Problems:   Primary immune deficiency disorder ([place])   . [START ON 05/07/2018] Influenza vac split quadrivalent PF  0.5 mL Intramuscular Tomorrow-1000    HPI: Melinda Gray is a 22 y.o. female with hx of immunodeficiency syndrome (twice weekly IgG which she has been off since June), asthma, R hip repair 2014, who comes to ED on 10-23 with fever, chills, body aches. 4 days ago she was in [place] for a [employer] and then began to feel ill. She also developed photophobia, neck pain and sore throat. She took naproxyn without improvement.  She denies sick contacts.  She did have a rash on her chest at onset of other sx.  She underwent LP in ED showing: Prot 21, Glc 54, WBC 10/6, RBC 5000/29. Gram stain no organisms. She was started on vanco/bactrim. Her Cx is ngtd.   Influenza (-) HIV (-)  Review of Systems: Review of Systems  Constitutional: Positive for fever. Negative for chills.  HENT: Positive for sore throat.   Respiratory: Negative for cough  and shortness of breath.   Gastrointestinal: Negative for constipation, diarrhea, nausea and vomiting.  Genitourinary: Negative for dysuria.  Musculoskeletal: Positive for neck pain.  Neurological: Positive for headaches.  no oral ulcers +rash Please see HPI. All other systems reviewed and negative.   Past Medical History:  Diagnosis Date  . Primary immune deficiency disorder [facility])     Social History   Tobacco Use  . Smoking status: Never Smoker  . Smokeless tobacco: Never Used  Substance Use Topics  . Alcohol use: Not on file    Comment: occasional  . Drug use: Never    Family History  Problem Relation Age of Onset  . Alport syndrome Father   . Diabetes Mellitus II Maternal Grandmother      Medications:  Scheduled: . [START ON 05/07/2018] Influenza vac split quadrivalent PF  0.5 mL Intramuscular Tomorrow-1000    Abtx:  Anti-infectives (From admission, onward)   Start     Dose/Rate Route Frequency Ordered Stop   05/06/18 1100  acyclovir (ZOVIRAX) 600 mg in dextrose 5 % 100 mL IVPB     600 mg 112 mL/hr over 60 Minutes Intravenous  Once 05/06/18 1017 05/06/18 1142   05/06/18 0600  sulfamethoxazole-trimethoprim (BACTRIM) 320 mg in dextrose 5 % 500 mL IVPB     320 mg 346.7 mL/hr over 90 Minutes Intravenous Every 8 hours 05/06/18 0537     05/06/18 0600  vancomycin (VANCOCIN) IVPB  1000 mg/200 mL premix     1,000 mg 200 mL/hr over 60 Minutes Intravenous Every 12 hours 05/06/18 0544     05/06/18 0230  acyclovir (ZOVIRAX) 610 mg in dextrose 5 % 100 mL IVPB     10 mg/kg  61.2 kg 112.2 mL/hr over 60 Minutes Intravenous  Once 05/06/18 0218 05/06/18 0544   05/05/18 1945  sulfamethoxazole-trimethoprim (BACTRIM) 300 mg in dextrose 5 % 250 mL IVPB     300 mg 268.8 mL/hr over 60 Minutes Intravenous  Once 05/05/18 1938 05/05/18 2202   05/05/18 1945  vancomycin (VANCOCIN) IVPB 1000 mg/200 mL premix     1,000 mg 200 mL/hr over 60 Minutes Intravenous  Once 05/05/18 1938 05/05/18  2242        OBJECTIVE: Blood pressure (!) 94/47, pulse 72, temperature 98.6 F (37 C), temperature source Oral, resp. rate 14, height _0  (1.651 m), weight 61.2 kg, last menstrual period 04/05/2018, SpO2 100 %.  Physical Exam  Constitutional: She is oriented to person, place, and time. She appears well-developed and well-nourished.  HENT:  Head: Normocephalic and atraumatic.  Mouth/Throat: Oropharynx is clear and moist. No oral lesions. No oropharyngeal exudate.  Eyes: Pupils are equal, round, and reactive to light. EOM are normal.    Cardiovascular: Normal rate, regular rhythm and normal heart sounds.  Pulmonary/Chest: Effort normal and breath sounds normal.  Abdominal: Soft. Bowel sounds are normal. She exhibits no distension. There is no tenderness.  Musculoskeletal: She exhibits no edema.  Neurological: She is alert and oriented to person, place, and time.  Skin: Skin is warm and dry. No rash noted.  Psychiatric: She has a normal mood and affect.    Lab Results Results for orders placed or performed during the hospital encounter of 05/05/18 (from the past 48 hour(s))  CBC with Differential     Status: Abnormal   Collection Time: 05/05/18  6:03 PM  Result Value Ref Range   WBC 20.7 (H) 4.0 - 10.5 K/uL   RBC 4.54 3.87 - 5.11 MIL/uL   Hemoglobin 12.7 12.0 - 15.0 g/dL   HCT 38.9 36.0 - 46.0 %   MCV 85.7 80.0 - 100.0 fL   MCH 28.0 26.0 - 34.0 pg   MCHC 32.6 30.0 - 36.0 g/dL   RDW 12.1 11.5 - 15.5 %   Platelets 266 150 - 400 K/uL   nRBC 0.2 0.0 - 0.2 %   Neutrophils Relative % 91 %   Neutro Abs 18.8 (H) 1.7 - 7.7 K/uL   Lymphocytes Relative 4 %   Lymphs Abs 0.8 0.7 - 4.0 K/uL   Monocytes Relative 4 %   Monocytes Absolute 0.9 0.1 - 1.0 K/uL   Eosinophils Relative 0 %   Eosinophils Absolute 0.0 0.0 - 0.5 K/uL   Basophils Relative 0 %   Basophils Absolute 0.1 0.0 - 0.1 K/uL   Immature Granulocytes 1 %   Abs Immature Granulocytes 0.14 (H) 0.00 - 0.07 K/uL    Comment:  Performed at [facility], [place] [address], [place], [place] [address]  Comprehensive metabolic panel     Status: Abnormal   Collection Time: 05/05/18  6:03 PM  Result Value Ref Range   Sodium 137 135 - 145 mmol/L   Potassium 3.6 3.5 - 5.1 mmol/L   Chloride 103 98 - 111 mmol/L   CO2 24 22 - 32 mmol/L   Glucose, Bld 92 70 - 99 mg/dL   BUN 7 6 - 20 mg/dL  Creatinine, Ser 0.67 0.44 - 1.00 mg/dL   Calcium 9.0 8.9 - 10.3 mg/dL   Total Protein 7.8 6.5 - 8.1 g/dL   Albumin 3.9 3.5 - 5.0 g/dL   AST 14 (L) 15 - 41 U/L   ALT 12 0 - 44 U/L   Alkaline Phosphatase 46 38 - 126 U/L   Total Bilirubin 0.7 0.3 - 1.2 mg/dL   GFR calc non Af Amer >60 >60 mL/min   GFR calc Af Amer >60 >60 mL/min    Comment: (NOTE) The eGFR has been calculated using the CKD EPI equation. This calculation has not been validated in all clinical situations. eGFR's persistently <60 mL/min signify possible Chronic Kidney Disease.    Anion gap 10 5 - 15    Comment: Performed at [facility], [place] [address] [place], [place] [address]  Urinalysis, Routine w reflex microscopic     Status: Abnormal   Collection Time: 05/05/18  6:03 PM  Result Value Ref Range   Color, Urine YELLOW YELLOW   APPearance HAZY (A) CLEAR   Specific Gravity, Urine 1.018 1.005 - 1.030   pH 5.0 5.0 - 8.0   Glucose, UA NEGATIVE NEGATIVE mg/dL   Hgb urine dipstick LARGE (A) NEGATIVE   Bilirubin Urine NEGATIVE NEGATIVE   Ketones, ur 20 (A) NEGATIVE mg/dL   Protein, ur NEGATIVE NEGATIVE mg/dL   Nitrite NEGATIVE NEGATIVE   Leukocytes, UA TRACE (A) NEGATIVE   RBC / HPF 0-5 0 - 5 RBC/hpf   WBC, UA 6-10 0 - 5 WBC/hpf   Bacteria, UA FEW (A) NONE SEEN   Squamous Epithelial / LPF 0-5 0 - 5   Mucus PRESENT     Comment: Performed at [facility], [place] [address], [place], [place] [address]  Influenza panel by PCR (type A & B)     Status: None   Collection Time: 05/05/18  6:03 PM  Result Value Ref  Range   Influenza A By PCR NEGATIVE NEGATIVE   Influenza B By PCR NEGATIVE NEGATIVE    Comment: (NOTE) The Xpert Xpress Flu assay is intended as an aid in the diagnosis of  influenza and should not be used as a sole basis for treatment.  This  assay is FDA approved for nasopharyngeal swab specimens only. Nasal  washings and aspirates are unacceptable for Xpert Xpress Flu testing. Performed at [facility], [place] [address] [place], [place] [address]   Culture, blood (routine x 2)     Status: None (Preliminary result)   Collection Time: 05/05/18  6:10 PM  Result Value Ref Range   Specimen Description      BLOOD LEFT ANTECUBITAL Performed at [place] [address], [place], [place] [address]    Special Requests      BOTTLES DRAWN AEROBIC AND ANAEROBIC Blood Culture results may not be optimal due to an excessive volume of blood received in culture bottles Performed at [place] [address], [place], [place] [address]    Culture      NO GROWTH < 12 HOURS Performed at [place] [address], [place], [place] [address]    Report Status PENDING   Culture, blood (routine x 2)     Status: None (Preliminary result)   Collection Time: 05/05/18  6:11 PM  Result Value Ref Range   Specimen Description      BLOOD RIGHT ANTECUBITAL Performed at [place]  [address], [place], [place] [address]    Special Requests      BOTTLES DRAWN AEROBIC AND ANAEROBIC Blood Culture results may not be optimal due to an excessive volume of blood received in culture bottles Performed at [place] [address], [place], [place] [address]    Culture      NO GROWTH < 12 HOURS Performed at [place] [facility] [address] [place], [place] [address]    Report Status PENDING   I-Stat beta hCG blood, ED     Status: None   Collection Time: 05/05/18  6:20 PM  Result Value Ref Range    I-stat hCG, quantitative <5.0 <5 mIU/mL   Comment 3            Comment:   GEST. AGE      CONC.  (mIU/mL)   <=1 WEEK        5 - 50     2 WEEKS       50 - 500     3 WEEKS       100 - 10,000     4 WEEKS     1,000 - 30,000        FEMALE AND NON-PREGNANT FEMALE:     LESS THAN 5 mIU/mL   I-Stat CG4 Lactic Acid, ED     Status: None   Collection Time: 05/05/18  6:23 PM  Result Value Ref Range   Lactic Acid, Venous 1.03 0.5 - 1.9 mmol/L  CSF cell count with differential collection tube #: 1     Status: Abnormal   Collection Time: 05/05/18  8:29 PM  Result Value Ref Range   Tube # 1    Color, CSF COLORLESS COLORLESS   Appearance, CSF HAZY (A) CLEAR   Supernatant NOT INDICATED    RBC Count, CSF 5,000 (H) 0 /cu mm   WBC, CSF 10 (H) 0 - 5 /cu mm   Other Cells, CSF TOO FEW TO COUNT, SMEAR AVAILABLE FOR REVIEW     Comment: PREDOMINATELY NEUTROPHILS, OCCASIONAL LYMPHOCYTES, RARE MACROPHAGE Performed at [facility], [place] [address] [place], [place] [address]   CSF cell count with differential collection tube #: 4     Status: Abnormal   Collection Time: 05/05/18  8:29 PM  Result Value Ref Range   Tube # 4    Color, CSF COLORLESS COLORLESS   Appearance, CSF CLEAR (A) CLEAR   Supernatant NOT INDICATED    RBC Count, CSF 29 (H) 0 /cu mm   WBC, CSF 6 (H) 0 - 5 /cu mm   Other Cells, CSF TOO FEW TO COUNT, SMEAR AVAILABLE FOR REVIEW     Comment: CYTOSPIN SMEAR PREDOMINATELY LYMPHOCYTES AND NEUTROPHILS, RARE MACROPHAGE Performed at [place] [address], [place], [place] [address]   CSF culture     Status: None (Preliminary result)   Collection Time: 05/05/18  8:29 PM  Result Value Ref Range   Specimen Description      CSF Performed at [facility] [address] [place], [place] [address]    Special Requests      NONE Performed at [facility], [place] [address] [place], [place] [address]    Gram Stain      WBC  PRESENT,BOTH PMN AND MONONUCLEAR NO ORGANISMS SEEN CYTOSPIN SMEAR Performed at [facility], [place] [address], [place], [place] [address]    Culture PENDING  Report Status PENDING   Glucose, CSF     Status: None   Collection Time: 05/05/18  8:29 PM  Result Value Ref Range   Glucose, CSF 54 40 - 70 mg/dL    Comment: Performed at [facility], [place] [address], [place], [place] [address]  Protein, CSF     Status: None   Collection Time: 05/05/18  8:29 PM  Result Value Ref Range   Total  Protein, CSF 21 15 - 45 mg/dL    Comment: Performed at [facility], [place] [address], [place], [place] [address]  I-Stat CG4 Lactic Acid, ED     Status: None   Collection Time: 05/05/18 10:45 PM  Result Value Ref Range   Lactic Acid, Venous 1.90 0.5 - 1.9 mmol/L  HIV antibody (Routine Testing)     Status: None   Collection Time: 05/06/18  5:56 AM  Result Value Ref Range   HIV Screen 4th Generation wRfx Non Reactive Non Reactive    Comment: (NOTE) Performed At: [facility] [place], [place] [id number] Rush Farmer MD [id number]   Basic metabolic panel     Status: Abnormal   Collection Time: 05/06/18  5:56 AM  Result Value Ref Range   Sodium 140 135 - 145 mmol/L   Potassium 3.2 (L) 3.5 - 5.1 mmol/L   Chloride 113 (H) 98 - 111 mmol/L   CO2 21 (L) 22 - 32 mmol/L   Glucose, Bld 100 (H) 70 - 99 mg/dL   BUN 6 6 - 20 mg/dL   Creatinine, Ser 0.75 0.44 - 1.00 mg/dL   Calcium 7.7 (L) 8.9 - 10.3 mg/dL   GFR calc non Af Amer >60 >60 mL/min   GFR calc Af Amer >60 >60 mL/min    Comment: (NOTE) The eGFR has been calculated using the CKD EPI equation. This calculation has not been validated in all clinical situations. eGFR's persistently <60 mL/min signify possible Chronic Kidney Disease.    Anion gap 6 5 - 15    Comment: Performed at [facility], [place] [address], [place], [place] [address]  CBC WITH DIFFERENTIAL      Status: Abnormal   Collection Time: 05/06/18  5:56 AM  Result Value Ref Range   WBC 12.1 (H) 4.0 - 10.5 K/uL   RBC 3.47 (L) 3.87 - 5.11 MIL/uL   Hemoglobin 9.7 (L) 12.0 - 15.0 g/dL   HCT 29.9 (L) 36.0 - 46.0 %   MCV 86.2 80.0 - 100.0 fL   MCH 28.0 26.0 - 34.0 pg   MCHC 32.4 30.0 - 36.0 g/dL   RDW 12.4 11.5 - 15.5 %   Platelets 213 150 - 400 K/uL   nRBC 0.0 0.0 - 0.2 %   Neutrophils Relative % 82 %   Neutro Abs 9.8 (H) 1.7 - 7.7 K/uL   Lymphocytes Relative 11 %   Lymphs Abs 1.4 0.7 - 4.0 K/uL   Monocytes Relative 6 %   Monocytes Absolute 0.7 0.1 - 1.0 K/uL   Eosinophils Relative 1 %   Eosinophils Absolute 0.1 0.0 - 0.5 K/uL   Basophils Relative 0 %   Basophils Absolute 0.1 0.0 - 0.1 K/uL   Immature Granulocytes 0 %   Abs Immature Granulocytes 0.05 0.00 - 0.07 K/uL    Comment: Performed at [facility], [place] [address] [place], [place] [address]      Component Value Date/Time   SDES  05/05/2018 2029  CSF Performed at [place] [address] [place], [place] [address]    SPECREQUEST  05/05/2018 2029    NONE Performed at [facility], [place] [address], [place], [place] [address]    CULT PENDING 05/05/2018 2029   REPTSTATUS PENDING 05/05/2018 2029   No results found. Recent Results (from the past 240 hour(s))  Culture, blood (routine x 2)     Status: None (Preliminary result)   Collection Time: 05/05/18  6:10 PM  Result Value Ref Range Status   Specimen Description   Final    BLOOD LEFT ANTECUBITAL Performed at [place] [address], [place], [place] [address]    Special Requests   Final    BOTTLES DRAWN AEROBIC AND ANAEROBIC Blood Culture results may not be optimal due to an excessive volume of blood received in culture bottles Performed at [place] [address], [place], [place] [address]    Culture   Final    NO GROWTH < 12 HOURS Performed at [place] [address] [place], [place] [address]    Report Status PENDING  Incomplete  Culture, blood (routine x 2)     Status: None (Preliminary result)   Collection Time: 05/05/18  6:11 PM  Result Value Ref Range Status   Specimen Description   Final    BLOOD RIGHT ANTECUBITAL Performed at [place] [address], [place], [place] [address]    Special Requests   Final    BOTTLES DRAWN AEROBIC AND ANAEROBIC Blood Culture results may not be optimal due to an excessive volume of blood received in culture bottles Performed at [place] [address], [place], [place] [address]    Culture   Final    NO GROWTH < 12 HOURS Performed at [place] [address] [place], [place] [address]    Report Status PENDING  Incomplete  CSF culture     Status: None (Preliminary result)   Collection Time: 05/05/18  8:29 PM  Result Value Ref Range Status   Specimen Description   Final    CSF Performed at [facility], [place] [address], [place], [place] [address]    Special Requests   Final    NONE Performed at [facility], [place] [address] [place], [place] [address]    Gram Stain   Final    WBC PRESENT,BOTH PMN AND MONONUCLEAR NO ORGANISMS SEEN CYTOSPIN SMEAR Performed at [facility], [place] [address], [place], [place] [address]    Culture PENDING  Incomplete   Report Status PENDING  Incomplete    Microbiology: Recent Results (from the past 240 hour(s))  Culture, blood (routine x 2)     Status: None (Preliminary result)   Collection Time: 05/05/18  6:10 PM  Result Value Ref Range Status   Specimen Description   Final    BLOOD LEFT ANTECUBITAL Performed at [place] [address] [place], [place] [address]    Special Requests   Final    BOTTLES DRAWN AEROBIC AND ANAEROBIC Blood Culture results may not be optimal due to an excessive volume of blood received in culture  bottles Performed at [facility] [address] [place], [place] [address]    Culture   Final    NO GROWTH < 12 HOURS Performed at [place] [address], [place], [place]  [address]    Report Status PENDING  Incomplete  Culture, blood (routine x 2)     Status: None (Preliminary result)   Collection Time: 05/05/18  6:11 PM  Result Value Ref Range Status   Specimen Description   Final    BLOOD RIGHT ANTECUBITAL Performed at [place] [address] [place], [place] [address]    Special Requests   Final    BOTTLES DRAWN AEROBIC AND ANAEROBIC Blood Culture results may not be optimal due to an excessive volume of blood received in culture bottles Performed at [place] [address] [place], [place] [address]    Culture   Final    NO GROWTH < 12 HOURS Performed at [place] [address], [place], [place] [address]    Report Status PENDING  Incomplete  CSF culture     Status: None (Preliminary result)   Collection Time: 05/05/18  8:29 PM  Result Value Ref Range Status   Specimen Description   Final    CSF Performed at [facility], [place] [address], [place], [place] [address]    Special Requests   Final    NONE Performed at [facility], [place] [address], [place], [place] [address]    Gram Stain   Final    WBC PRESENT,BOTH PMN AND MONONUCLEAR NO ORGANISMS SEEN CYTOSPIN SMEAR Performed at [facility], [place] [address] [place], [place] [address]    Culture PENDING  Incomplete   Report Status PENDING  Incomplete    Radiographs and labs were personally reviewed by me.   Bobby Rumpf, MD [facility] for [facility] [place] [facility] [phone] 05/06/2018, 2:28 PM

## 2018-05-06 NOTE — Progress Notes (Signed)
Pharmacy Antibiotic Note  Melinda Gray is a 22 y.o. female admitted on 05/05/2018 with meningitis.  Pharmacy has been consulted for Bactrim, acyclovir, vancomycin dosing. 05/06/2018:  Afebrile  Mild leukocytosis  Renal function at baseline, est CrCl ~146ml/min  Cx data negative to date  Plan:  Bactrim IV 320mg  q8hr  Acyclovir 600mg  IV x1 now then ganciclovir 300mg  IV q12h  Vancomycin 1gm iv q12hr (goal AUC > 500 and Cmin 15-20 mcg/mL)  Monitor renal function and cx data    Height: 5\' 5"  (165.1 cm) Weight: 135 lb (61.2 kg) IBW/kg (Calculated) : 57  Temp (24hrs), Avg:99 F (37.2 C), Min:98.8 F (37.1 C), Max:99.3 F (37.4 C)  Recent Labs  Lab 05/05/18 1803 05/05/18 1823 05/05/18 2245 05/06/18 0556  WBC 20.7*  --   --  12.1*  CREATININE 0.67  --   --  0.75  LATICACIDVEN  --  1.03 1.90  --     Estimated Creatinine Clearance: 98.4 mL/min (by C-G formula based on SCr of 0.75 mg/dL).    Allergies  Allergen Reactions  . Amoxicillin   . Augmentin [Amoxicillin-Pot Clavulanate]     Antimicrobials this admission: Vancomycin 05/06/2018 >> Bactrim 05/05/2018 >>  Acyclovir 05/06/2018 x1  Dose adjustments this admission: -  Microbiology results: -  Thank you for allowing pharmacy to be a part of this patient's care.  Elson Clan 05/06/2018 1:39 PM

## 2018-05-06 NOTE — Progress Notes (Signed)
[facility] Infusion Coordinator will follow pt with ID team to support Home Infusion Pharmacy services for home IV ABX and/or IVIG at DC.  If patient discharges after hours, please call [phone].   Melinda Gray 05/06/2018, 4:53 PM

## 2018-05-06 NOTE — ED Notes (Signed)
ED TO INPATIENT HANDOFF REPORT  Name/Age/Gender Melinda Gray 22 y.o. female  Code Status   Home/SNF/Other Home  Chief Complaint sore throat,fever,neck pain, multiple complaint  Level of Care/Admitting Diagnosis ED Disposition    ED Disposition Condition [facility]: [place] [100102]  Level of Care: Med-Surg [16]  Diagnosis: SIRS (systemic inflammatory response syndrome) [facility]) [768088]  Admitting Physician: Rise Patience [phone]  Attending Physician: Rise Patience [email or web address]  PT Class (Do Not Modify): Observation [104]  PT Acc Code (Do Not Modify): Observation [10022]       Medical History Past Medical History:  Diagnosis Date  . Primary immune deficiency disorder (HCC)     Allergies Allergies  Allergen Reactions  . Amoxicillin   . Augmentin [Amoxicillin-Pot Clavulanate]     IV Location/Drains/Wounds Patient Lines/Drains/Airways Status   Active Line/Drains/Airways    Name:   Placement date:   Placement time:   Site:   Days:   Peripheral IV 05/05/18 Left Antecubital   05/05/18    1813    Antecubital   1   Peripheral IV 05/05/18 Right Antecubital   05/05/18    1848    Antecubital   1          Labs/Imaging Results for orders placed or performed during the hospital encounter of 05/05/18 (from the past 48 hour(s))  CBC with Differential     Status: Abnormal   Collection Time: 05/05/18  6:03 PM  Result Value Ref Range   WBC 20.7 (H) 4.0 - 10.5 K/uL   RBC 4.54 3.87 - 5.11 MIL/uL   Hemoglobin 12.7 12.0 - 15.0 g/dL   HCT 38.9 36.0 - 46.0 %   MCV 85.7 80.0 - 100.0 fL   MCH 28.0 26.0 - 34.0 pg   MCHC 32.6 30.0 - 36.0 g/dL   RDW 12.1 11.5 - 15.5 %   Platelets 266 150 - 400 K/uL   nRBC 0.2 0.0 - 0.2 %   Neutrophils Relative % 91 %   Neutro Abs 18.8 (H) 1.7 - 7.7 K/uL   Lymphocytes Relative 4 %   Lymphs Abs 0.8 0.7 - 4.0 K/uL   Monocytes Relative 4 %   Monocytes Absolute 0.9 0.1 - 1.0 K/uL   Eosinophils  Relative 0 %   Eosinophils Absolute 0.0 0.0 - 0.5 K/uL   Basophils Relative 0 %   Basophils Absolute 0.1 0.0 - 0.1 K/uL   Immature Granulocytes 1 %   Abs Immature Granulocytes 0.14 (H) 0.00 - 0.07 K/uL    Comment: Performed at [facility], [place] [address] [place], [place] [address]  Comprehensive metabolic panel     Status: Abnormal   Collection Time: 05/05/18  6:03 PM  Result Value Ref Range   Sodium 137 135 - 145 mmol/L   Potassium 3.6 3.5 - 5.1 mmol/L   Chloride 103 98 - 111 mmol/L   CO2 24 22 - 32 mmol/L   Glucose, Bld 92 70 - 99 mg/dL   BUN 7 6 - 20 mg/dL   Creatinine, Ser 0.67 0.44 - 1.00 mg/dL   Calcium 9.0 8.9 - 10.3 mg/dL   Total Protein 7.8 6.5 - 8.1 g/dL   Albumin 3.9 3.5 - 5.0 g/dL   AST 14 (L) 15 - 41 U/L   ALT 12 0 - 44 U/L   Alkaline Phosphatase 46 38 - 126 U/L   Total Bilirubin 0.7 0.3 - 1.2 mg/dL   GFR calc non Af  Amer >60 >60 mL/min   GFR calc Af Amer >60 >60 mL/min    Comment: (NOTE) The eGFR has been calculated using the CKD EPI equation. This calculation has not been validated in all clinical situations. eGFR's persistently <60 mL/min signify possible Chronic Kidney Disease.    Anion gap 10 5 - 15    Comment: Performed at [facility], [place] [address] [place], [place] [address]  Urinalysis, Routine w reflex microscopic     Status: Abnormal   Collection Time: 05/05/18  6:03 PM  Result Value Ref Range   Color, Urine YELLOW YELLOW   APPearance HAZY (A) CLEAR   Specific Gravity, Urine 1.018 1.005 - 1.030   pH 5.0 5.0 - 8.0   Glucose, UA NEGATIVE NEGATIVE mg/dL   Hgb urine dipstick LARGE (A) NEGATIVE   Bilirubin Urine NEGATIVE NEGATIVE   Ketones, ur 20 (A) NEGATIVE mg/dL   Protein, ur NEGATIVE NEGATIVE mg/dL   Nitrite NEGATIVE NEGATIVE   Leukocytes, UA TRACE (A) NEGATIVE   RBC / HPF 0-5 0 - 5 RBC/hpf   WBC, UA 6-10 0 - 5 WBC/hpf   Bacteria, UA FEW (A) NONE SEEN   Squamous Epithelial / LPF 0-5 0 - 5   Mucus  PRESENT     Comment: Performed at [facility], [place] [address], [place], [place] [address]  Influenza panel by PCR (type A & B)     Status: None   Collection Time: 05/05/18  6:03 PM  Result Value Ref Range   Influenza A By PCR NEGATIVE NEGATIVE   Influenza B By PCR NEGATIVE NEGATIVE    Comment: (NOTE) The Xpert Xpress Flu assay is intended as an aid in the diagnosis of  influenza and should not be used as a sole basis for treatment.  This  assay is FDA approved for nasopharyngeal swab specimens only. Nasal  washings and aspirates are unacceptable for Xpert Xpress Flu testing. Performed at [facility], [place] [address] [place], [place] [address]   I-Stat beta hCG blood, ED     Status: None   Collection Time: 05/05/18  6:20 PM  Result Value Ref Range   I-stat hCG, quantitative <5.0 <5 mIU/mL   Comment 3            Comment:   GEST. AGE      CONC.  (mIU/mL)   <=1 WEEK        5 - 50     2 WEEKS       50 - 500     3 WEEKS       100 - 10,000     4 WEEKS     1,000 - 30,000        FEMALE AND NON-PREGNANT FEMALE:     LESS THAN 5 mIU/mL   I-Stat CG4 Lactic Acid, ED     Status: None   Collection Time: 05/05/18  6:23 PM  Result Value Ref Range   Lactic Acid, Venous 1.03 0.5 - 1.9 mmol/L  CSF cell count with differential collection tube #: 1     Status: Abnormal   Collection Time: 05/05/18  8:29 PM  Result Value Ref Range   Tube # 1    Color, CSF COLORLESS COLORLESS   Appearance, CSF HAZY (A) CLEAR   Supernatant NOT INDICATED    RBC Count, CSF 5,000 (H) 0 /cu mm   WBC, CSF 10 (H) 0 - 5 /cu mm   Other Cells,  CSF TOO FEW TO COUNT, SMEAR AVAILABLE FOR REVIEW     Comment: PREDOMINATELY NEUTROPHILS, OCCASIONAL LYMPHOCYTES, RARE MACROPHAGE Performed at [facility], [place] [address], [place], [place] [address]   Glucose, CSF     Status: None   Collection Time: 05/05/18  8:29 PM  Result Value Ref Range   Glucose, CSF 54 40 - 70 mg/dL     Comment: Performed at [facility], [place] [address] [place], [place] [address]  Protein, CSF     Status: None   Collection Time: 05/05/18  8:29 PM  Result Value Ref Range   Total  Protein, CSF 21 15 - 45 mg/dL    Comment: Performed at [facility], [place] [address], [place], [place] [address]  I-Stat CG4 Lactic Acid, ED     Status: None   Collection Time: 05/05/18 10:45 PM  Result Value Ref Range   Lactic Acid, Venous 1.90 0.5 - 1.9 mmol/L   No results found. None  Pending Labs Unresulted Labs (From admission, onward)    Start     Ordered   05/05/18 1931  CSF cell count with differential collection tube #: 4  [facility] ED ADULT CSF PANEL)  STAT,   STAT    Question:  collection tube #  Answer:  4   05/05/18 1931   05/05/18 1931  CSF culture  [facility] ED ADULT CSF PANEL)  STAT,   STAT    Question:  Are there also cytology or pathology orders on this specimen?  Answer:  No   05/05/18 1931   05/05/18 1745  Culture, blood (routine x 2)  BLOOD CULTURE X 2,   STAT     05/05/18 1745   05/05/18 1745  Urine culture  STAT,   STAT     05/05/18 1745          Vitals/Pain Today's Vitals   05/05/18 2045 05/05/18 2100 05/05/18 2115 05/05/18 2145  BP: (!) 111/58 (!) 105/56 (!) 104/57 (!) 103/58  Pulse: (!) 112 96 100 (!) 116  Resp: 11 (!) '22 17 15  ' Temp:      TempSrc:      SpO2: 96% 100% 100% 100%  Weight:      Height:      PainSc:        Isolation Precautions Droplet precaution  Medications Medications  sodium chloride 0.9 % bolus 1,000 mL (0 mLs Intravenous Stopped 05/05/18 2202)    Followed by  0.9 %  sodium chloride infusion (1,000 mLs Intravenous New Bag/Given 05/05/18 1947)  ketorolac (TORADOL) 30 MG/ML injection 30 mg (30 mg Intravenous Given 05/05/18 1943)  sulfamethoxazole-trimethoprim (BACTRIM) 300 mg in dextrose 5 % 250 mL IVPB (0 mg Intravenous Stopped 05/05/18 2202)  vancomycin (VANCOCIN) IVPB 1000 mg/200 mL premix (0 mg Intravenous  Stopped 05/05/18 2242)  lidocaine (PF) (XYLOCAINE) 1 % injection 5 mL (5 mLs Infiltration Given 05/05/18 1954)  sodium chloride 0.9 % bolus 1,000 mL (0 mLs Intravenous Stopped 05/05/18 2203)  metoCLOPramide (REGLAN) injection 10 mg (10 mg Intravenous Given 05/05/18 2040)  diphenhydrAMINE (BENADRYL) injection 25 mg (25 mg Intravenous Given 05/05/18 2040)    Mobility walks

## 2018-05-07 ENCOUNTER — Other Ambulatory Visit: Payer: Self-pay | Admitting: Hematology and Oncology

## 2018-05-07 DIAGNOSIS — D800 Hereditary hypogammaglobulinemia: Secondary | ICD-10-CM | POA: Diagnosis present

## 2018-05-07 DIAGNOSIS — D848 Other specified immunodeficiencies: Secondary | ICD-10-CM | POA: Diagnosis present

## 2018-05-07 DIAGNOSIS — M545 Low back pain: Secondary | ICD-10-CM | POA: Diagnosis not present

## 2018-05-07 DIAGNOSIS — Z880 Allergy status to penicillin: Secondary | ICD-10-CM | POA: Diagnosis not present

## 2018-05-07 DIAGNOSIS — G43909 Migraine, unspecified, not intractable, without status migrainosus: Secondary | ICD-10-CM | POA: Diagnosis present

## 2018-05-07 DIAGNOSIS — D801 Nonfamilial hypogammaglobulinemia: Secondary | ICD-10-CM | POA: Diagnosis not present

## 2018-05-07 DIAGNOSIS — G8929 Other chronic pain: Secondary | ICD-10-CM | POA: Diagnosis present

## 2018-05-07 DIAGNOSIS — Z8249 Family history of ischemic heart disease and other diseases of the circulatory system: Secondary | ICD-10-CM | POA: Diagnosis not present

## 2018-05-07 DIAGNOSIS — R51 Headache: Secondary | ICD-10-CM | POA: Diagnosis not present

## 2018-05-07 DIAGNOSIS — M25551 Pain in right hip: Secondary | ICD-10-CM | POA: Diagnosis present

## 2018-05-07 DIAGNOSIS — G030 Nonpyogenic meningitis: Secondary | ICD-10-CM | POA: Diagnosis not present

## 2018-05-07 DIAGNOSIS — D849 Immunodeficiency, unspecified: Secondary | ICD-10-CM | POA: Diagnosis not present

## 2018-05-07 DIAGNOSIS — R6889 Other general symptoms and signs: Secondary | ICD-10-CM | POA: Diagnosis not present

## 2018-05-07 DIAGNOSIS — Z833 Family history of diabetes mellitus: Secondary | ICD-10-CM | POA: Diagnosis not present

## 2018-05-07 DIAGNOSIS — Z23 Encounter for immunization: Secondary | ICD-10-CM | POA: Diagnosis not present

## 2018-05-07 DIAGNOSIS — J45909 Unspecified asthma, uncomplicated: Secondary | ICD-10-CM | POA: Diagnosis present

## 2018-05-07 DIAGNOSIS — E876 Hypokalemia: Secondary | ICD-10-CM | POA: Diagnosis present

## 2018-05-07 DIAGNOSIS — H53149 Visual discomfort, unspecified: Secondary | ICD-10-CM | POA: Diagnosis not present

## 2018-05-07 DIAGNOSIS — I959 Hypotension, unspecified: Secondary | ICD-10-CM | POA: Diagnosis not present

## 2018-05-07 LAB — CBC WITH DIFFERENTIAL/PLATELET
ABS IMMATURE GRANULOCYTES: 0.02 10*3/uL (ref 0.00–0.07)
BASOS PCT: 1 %
Basophils Absolute: 0.1 10*3/uL (ref 0.0–0.1)
Eosinophils Absolute: 0.3 10*3/uL (ref 0.0–0.5)
Eosinophils Relative: 4 %
HEMATOCRIT: 29.7 % — AB (ref 36.0–46.0)
Hemoglobin: 9.4 g/dL — ABNORMAL LOW (ref 12.0–15.0)
Immature Granulocytes: 0 %
LYMPHS ABS: 1.7 10*3/uL (ref 0.7–4.0)
Lymphocytes Relative: 24 %
MCH: 27.6 pg (ref 26.0–34.0)
MCHC: 31.6 g/dL (ref 30.0–36.0)
MCV: 87.1 fL (ref 80.0–100.0)
MONO ABS: 0.6 10*3/uL (ref 0.1–1.0)
MONOS PCT: 8 %
NEUTROS ABS: 4.4 10*3/uL (ref 1.7–7.7)
Neutrophils Relative %: 63 %
PLATELETS: 211 10*3/uL (ref 150–400)
RBC: 3.41 MIL/uL — ABNORMAL LOW (ref 3.87–5.11)
RDW: 12.5 % (ref 11.5–15.5)
WBC: 7 10*3/uL (ref 4.0–10.5)
nRBC: 0 % (ref 0.0–0.2)

## 2018-05-07 LAB — URINE CULTURE

## 2018-05-07 LAB — HERPES SIMPLEX VIRUS(HSV) DNA BY PCR
HSV 1 DNA: NEGATIVE
HSV 2 DNA: NEGATIVE

## 2018-05-07 LAB — HEPATIC FUNCTION PANEL
ALBUMIN: 3.4 g/dL — AB (ref 3.5–5.0)
ALK PHOS: 41 U/L (ref 38–126)
ALT: 12 U/L (ref 0–44)
AST: 12 U/L — ABNORMAL LOW (ref 15–41)
BILIRUBIN INDIRECT: 0.2 mg/dL — AB (ref 0.3–0.9)
Bilirubin, Direct: 0.1 mg/dL (ref 0.0–0.2)
TOTAL PROTEIN: 6.7 g/dL (ref 6.5–8.1)
Total Bilirubin: 0.3 mg/dL (ref 0.3–1.2)

## 2018-05-07 LAB — BASIC METABOLIC PANEL
ANION GAP: 7 (ref 5–15)
CHLORIDE: 113 mmol/L — AB (ref 98–111)
CO2: 20 mmol/L — ABNORMAL LOW (ref 22–32)
Calcium: 8 mg/dL — ABNORMAL LOW (ref 8.9–10.3)
Creatinine, Ser: 0.65 mg/dL (ref 0.44–1.00)
GFR calc Af Amer: >60 mL/min (ref >60)
Glucose, Bld: 96 mg/dL (ref 70–99)
POTASSIUM: 3.5 mmol/L (ref 3.5–5.1)
Sodium: 140 mmol/L (ref 135–145)

## 2018-05-07 LAB — C DIFFICILE QUICK SCREEN W PCR REFLEX
C DIFFICILE (CDIFF) TOXIN: NEGATIVE
C DIFFICLE (CDIFF) ANTIGEN: NEGATIVE
C Diff interpretation: NOT DETECTED

## 2018-05-07 LAB — HIV-1 RNA QUANT-NO REFLEX-BLD: LOG10 HIV-1 RNA: UNDETERMINED {Log_copies}/mL

## 2018-05-07 MED ORDER — IMMUNE GLOBULIN (HUMAN) 10 GM/100ML IV SOLN
400.0000 mg/kg | INTRAVENOUS | Status: DC
  Administered 2018-05-07: 25 g via INTRAVENOUS
  Filled 2018-05-07: qty 200

## 2018-05-07 MED ORDER — IMMUNE GLOBULIN (HUMAN) 10 GM/100ML IV SOLN
400.0000 mg/kg | INTRAVENOUS | Status: AC
  Administered 2018-05-08: 25 g via INTRAVENOUS
  Filled 2018-05-07: qty 200

## 2018-05-07 NOTE — Progress Notes (Signed)
PROGRESS NOTE    Melinda Gray  ZOX:[id number] DOB: 10/04/1995 DOA: 05/05/2018 PCP: Patient, No Pcp Per   Brief Narrative: Patient is a 22 year old female with past medical history of primary immunodeficiency disorder who takes subcutaneous Ig injection every other weeks who presented to the emergency department with complaints of persistent headache, fever and chills along with generalized body ache.  Also reported of photophobia, sore throat and neck pain.  Since she was immunocompromised, there was concern for meningitis versus encephalitis and she was started on broad-spectrum antibiotics.ID following.Hematology also consulted today for need of IVIG.  Assessment & Plan:   Principal Problem:   Aseptic meningitis Active Problems:   SIRS (systemic inflammatory response syndrome) (HCC)   Primary immune deficiency disorder (HCC)  Meningitis: Started on empiric antibiotics with meropenem, vancomycin and Valtrex.  ID following.  Patient presented with fever, neck rigidity.  The symptoms have resolved.  She is hemodynamically  Stable. Continue current antibiotics. Lumbar puncture was done.  It was a bloody tap showing 5000 RBCs which corresponds to 10 WBCs.  CSF white cell count was just 10 so there may not be significant white cell count present on CSF itself.  Protein was normal.Culture and Gram stain has been sent.  Gram stain did not show any bacteria but showed both polynuclear and mononuclear cells.  We will follow the culture.NGTD ID has sent fungal serologies  Primary immunodeficiency disorder: Was taking IgG injection every other week subcutaneously while she was at [facility] [place].  She has not taken it for last few months since he moved here in June 2019.  We have requested for hematology consultation to see if she needs IVIG here.  She needs to follow-up with immunology as an outpatient for regular IVIG injection after discharge.   DVT prophylaxis: SCD Code Status: Full Family  Communication: Discussed with mother on phone Disposition Plan: Home after full work-up   Consultants: ID, hematology  Procedures: None  Antimicrobials: Merrem, vancomycin, Valtrex  Subjective: Patient seen and examined the bedside this morning.  Hemodynamically stable.  Complains of mild headache but denies any nausea, vomiting, photophobia or neck pain.  Objective: Vitals:   05/06/18 1350 05/06/18 1443 05/06/18 2050 05/07/18 0523  BP: (!) 94/47 (!) 100/52 (!) 115/56 (!) 108/52  Pulse: 72  88 77  Resp: 14  16 16   Temp: 98.6 F (37 C)  98.4 F (36.9 C) 98.4 F (36.9 C)  TempSrc: Oral  Oral Oral  SpO2: 100%  100% 99%  Weight:      Height:        Intake/Output Summary (Last 24 hours) at 05/07/2018 0946 Last data filed at 05/06/2018 1727 Gross per 24 hour  Intake 1657.54 ml  Output -  Net 1657.54 ml   Filed Weights   05/05/18 1638  Weight: 61.2 kg    Examination:  General exam: Appears calm and comfortable ,Not in distress,average built HEENT:PERRL,Oral mucosa moist, Ear/Nose normal on gross exam,No neck rigidity Respiratory system: Bilateral equal air entry, normal vesicular breath sounds, no wheezes or crackles  Cardiovascular system: S1 & S2 heard, RRR. No JVD, murmurs, rubs, gallops or clicks. No pedal edema. Gastrointestinal system: Abdomen is nondistended, soft and nontender. No organomegaly or masses felt. Normal bowel sounds heard. Central nervous system: Alert and oriented. No focal neurological deficits. Extremities: No edema, no clubbing ,no cyanosis, distal peripheral pulses palpable. Skin: No rashes, lesions or ulcers,no icterus ,no pallor MSK: Normal muscle bulk,tone ,power Psychiatry: Judgement and insight appear normal. Mood &  affect appropriate.     Data Reviewed: I have personally reviewed following labs and imaging studies  CBC: Recent Labs  Lab 05/05/18 1803 05/06/18 0556 05/07/18 0601  WBC 20.7* 12.1* 7.0  NEUTROABS 18.8* 9.8* 4.4    HGB 12.7 9.7* 9.4*  HCT 38.9 29.9* 29.7*  MCV 85.7 86.2 87.1  PLT 266 213 211   Basic Metabolic Panel: Recent Labs  Lab 05/05/18 1803 05/06/18 0556 05/07/18 0601  NA 137 140 140  K 3.6 3.2* 3.5  CL 103 113* 113*  CO2 24 21* 20*  GLUCOSE 92 100* 96  BUN 7 6 <5*  CREATININE 0.67 0.75 0.65  CALCIUM 9.0 7.7* 8.0*   GFR: Estimated Creatinine Clearance: 98.4 mL/min (by C-G formula based on SCr of 0.65 mg/dL). Liver Function Tests: Recent Labs  Lab 05/05/18 1803  AST 14*  ALT 12  ALKPHOS 46  BILITOT 0.7  PROT 7.8  ALBUMIN 3.9   No results for input(s): LIPASE, AMYLASE in the last 168 hours. No results for input(s): AMMONIA in the last 168 hours. Coagulation Profile: No results for input(s): INR, PROTIME in the last 168 hours. Cardiac Enzymes: No results for input(s): CKTOTAL, CKMB, CKMBINDEX, TROPONINI in the last 168 hours. BNP (last 3 results) No results for input(s): PROBNP in the last 8760 hours. HbA1C: No results for input(s): HGBA1C in the last 72 hours. CBG: No results for input(s): GLUCAP in the last 168 hours. Lipid Profile: No results for input(s): CHOL, HDL, LDLCALC, TRIG, CHOLHDL, LDLDIRECT in the last 72 hours. Thyroid Function Tests: No results for input(s): TSH, T4TOTAL, FREET4, T3FREE, THYROIDAB in the last 72 hours. Anemia Panel: No results for input(s): VITAMINB12, FOLATE, FERRITIN, TIBC, IRON, RETICCTPCT in the last 72 hours. Sepsis Labs: Recent Labs  Lab 05/05/18 1823 05/05/18 2245  LATICACIDVEN 1.03 1.90    Recent Results (from the past 240 hour(s))  Urine culture     Status: Abnormal   Collection Time: 05/05/18  6:01 PM  Result Value Ref Range Status   Specimen Description   Final    URINE, CLEAN CATCH Performed at [facility], [address] [place], [place] [address]    Special Requests   Final    NONE Performed at [facility], [address] [place], [place] [address]    Culture  MULTIPLE SPECIES PRESENT, SUGGEST RECOLLECTION (A)  Final   Report Status 05/07/2018 FINAL  Final  Culture, blood (routine x 2)     Status: None (Preliminary result)   Collection Time: 05/05/18  6:10 PM  Result Value Ref Range Status   Specimen Description   Final    BLOOD LEFT ANTECUBITAL Performed at [facility], [address], [place], [place] [address]    Special Requests   Final    BOTTLES DRAWN AEROBIC AND ANAEROBIC Blood Culture results may not be optimal due to an excessive volume of blood received in culture bottles Performed at [facility], [address] [place], [place] [address]    Culture   Final    NO GROWTH 2 DAYS Performed at [facility], [address], [place], [place] [address]    Report Status PENDING  Incomplete  Culture, blood (routine x 2)     Status: None (Preliminary result)   Collection Time: 05/05/18  6:11 PM  Result Value Ref Range Status   Specimen Description   Final    BLOOD RIGHT ANTECUBITAL Performed at [facility],  [address] [place], [place] [address]    Special Requests   Final    BOTTLES DRAWN AEROBIC AND ANAEROBIC Blood Culture results may not be optimal due to an excessive volume of blood received in culture bottles Performed at [facility], [address], [place], [place] [address]    Culture   Final    NO GROWTH 2 DAYS Performed at [facility], [address], [place], [place] [address]    Report Status PENDING  Incomplete  CSF culture     Status: None (Preliminary result)   Collection Time: 05/05/18  8:29 PM  Result Value Ref Range Status   Specimen Description   Final    CSF Performed at [facility], [address], [place], [place] [address]    Special Requests   Final    NONE Performed at [facility], [address] [place], [place] [address]    Gram Stain   Final    WBC PRESENT,BOTH PMN AND  MONONUCLEAR NO ORGANISMS SEEN CYTOSPIN SMEAR    Culture   Final    NO GROWTH 1 DAY Performed at [facility], [address] [place], [place] [address]    Report Status PENDING  Incomplete  C difficile quick scan w PCR reflex     Status: None   Collection Time: 05/06/18  8:24 PM  Result Value Ref Range Status   C Diff antigen NEGATIVE NEGATIVE Final   C Diff toxin NEGATIVE NEGATIVE Final   C Diff interpretation No C. difficile detected.  Final    Comment: Performed at [facility], [address] [place], [place] [address]         Radiology Studies: No results found.      Scheduled Meds: . Influenza vac split quadrivalent PF  0.5 mL Intramuscular Tomorrow-1000  . valACYclovir  1,000 mg Oral TID   Continuous Infusions: . meropenem (MERREM) IV 2 g (05/06/18 2315)  . vancomycin 1,000 mg (05/07/18 0629)     LOS: 0 days    Time spent: 25 mins.More than 50% of that time was spent in counseling and/or coordination of care.      Burnadette Pop, MD [facility] Pager [phone]  If 7PM-7AM, please contact night-coverage [email or web address] Password [facility] 05/07/2018, 9:46 AM

## 2018-05-07 NOTE — Progress Notes (Signed)
INFECTIOUS DISEASE PROGRESS NOTE  ID: Melinda Gray is a 22 y.o. female with  Principal Problem:   Aseptic meningitis Active Problems:   SIRS (systemic inflammatory response syndrome) (HCC)   Primary immune deficiency disorder (HCC)  Subjective: C/o headache (which she suspects is from caffeine withdrawal) and back ache from LP.   Abtx:  Anti-infectives (From admission, onward)   Start     Dose/Rate Route Frequency Ordered Stop   05/06/18 1600  valACYclovir (VALTREX) tablet 1,000 mg     1,000 mg Oral 3 times daily 05/06/18 1456     05/06/18 1600  meropenem (MERREM) 2 g in sodium chloride 0.9 % 100 mL IVPB     2 g 200 mL/hr over 30 Minutes Intravenous Every 8 hours 05/06/18 1501     05/06/18 1100  acyclovir (ZOVIRAX) 600 mg in dextrose 5 % 100 mL IVPB     600 mg 112 mL/hr over 60 Minutes Intravenous  Once 05/06/18 1017 05/06/18 1200   05/06/18 0600  sulfamethoxazole-trimethoprim (BACTRIM) 320 mg in dextrose 5 % 500 mL IVPB  Status:  Discontinued     320 mg 346.7 mL/hr over 90 Minutes Intravenous Every 8 hours 05/06/18 0537 05/06/18 1456   05/06/18 0600  vancomycin (VANCOCIN) IVPB 1000 mg/200 mL premix     1,000 mg 200 mL/hr over 60 Minutes Intravenous Every 12 hours 05/06/18 0544     05/06/18 0230  acyclovir (ZOVIRAX) 610 mg in dextrose 5 % 100 mL IVPB     10 mg/kg  61.2 kg 112.2 mL/hr over 60 Minutes Intravenous  Once 05/06/18 0218 05/06/18 0544   05/05/18 1945  sulfamethoxazole-trimethoprim (BACTRIM) 300 mg in dextrose 5 % 250 mL IVPB     300 mg 268.8 mL/hr over 60 Minutes Intravenous  Once 05/05/18 1938 05/05/18 2202   05/05/18 1945  vancomycin (VANCOCIN) IVPB 1000 mg/200 mL premix     1,000 mg 200 mL/hr over 60 Minutes Intravenous  Once 05/05/18 1938 05/05/18 2242      Medications:  Scheduled: . Influenza vac split quadrivalent PF  0.5 mL Intramuscular Tomorrow-1000  . valACYclovir  1,000 mg Oral TID    Objective: Vital signs in last 24 hours: Temp:  [98.4  F (36.9 C)-98.6 F (37 C)] 98.4 F (36.9 C) (10/25 0523) Pulse Rate:  [72-88] 77 (10/25 0523) Resp:  [14-16] 16 (10/25 0523) BP: (94-115)/(47-56) 108/52 (10/25 0523) SpO2:  [99 %-100 %] 99 % (10/25 0523)   General appearance: alert, cooperative and no distress Neck: FROM Resp: clear to auscultation bilaterally Cardio: regular rate and rhythm GI: normal findings: bowel sounds normal and soft, non-tender  Lab Results Recent Labs    05/06/18 0556 05/07/18 0601  WBC 12.1* 7.0  HGB 9.7* 9.4*  HCT 29.9* 29.7*  NA 140 140  K 3.2* 3.5  CL 113* 113*  CO2 21* 20*  BUN 6 <5*  CREATININE 0.75 0.65   Liver Panel Recent Labs    05/05/18 1803  PROT 7.8  ALBUMIN 3.9  AST 14*  ALT 12  ALKPHOS 46  BILITOT 0.7   Sedimentation Rate No results for input(s): ESRSEDRATE in the last 72 hours. C-Reactive Protein No results for input(s): CRP in the last 72 hours.  Microbiology: Recent Results (from the past 240 hour(s))  Urine culture     Status: Abnormal   Collection Time: 05/05/18  6:01 PM  Result Value Ref Range Status   Specimen Description   Final    URINE, CLEAN CATCH Performed at  [facility], [address] [place], [place] [address]    Special Requests   Final    NONE Performed at [facility], [address], [place], [place] [address]    Culture MULTIPLE SPECIES PRESENT, SUGGEST RECOLLECTION (A)  Final   Report Status 05/07/2018 FINAL  Final  Culture, blood (routine x 2)     Status: None (Preliminary result)   Collection Time: 05/05/18  6:10 PM  Result Value Ref Range Status   Specimen Description   Final    BLOOD LEFT ANTECUBITAL Performed at [facility], [address], [place], [place] [address]    Special Requests   Final    BOTTLES DRAWN AEROBIC AND ANAEROBIC Blood Culture results may not be optimal due to an excessive volume of blood received in culture bottles Performed at [facility], [address] [place], [place] [address]    Culture   Final    NO GROWTH 2 DAYS Performed at [facility], [address] [place], [place] [address]    Report Status PENDING  Incomplete  Culture, blood (routine x 2)     Status: None (Preliminary result)   Collection Time: 05/05/18  6:11 PM  Result Value Ref Range Status   Specimen Description   Final    BLOOD RIGHT ANTECUBITAL Performed at [facility], [address], [place], [place] [address]    Special Requests   Final    BOTTLES DRAWN AEROBIC AND ANAEROBIC Blood Culture results may not be optimal due to an excessive volume of blood received in culture bottles Performed at [facility], [address], [place], [place] [address]    Culture   Final    NO GROWTH 2 DAYS Performed at [facility], [address], [place], [place] [address]    Report Status PENDING  Incomplete  CSF culture     Status: None (Preliminary result)   Collection Time: 05/05/18  8:29 PM  Result Value Ref Range Status   Specimen Description   Final    CSF Performed at [facility], [address], [place], [place] [address]    Special Requests   Final    NONE Performed at [facility], [address], [place], [place] [address]    Gram Stain   Final    WBC PRESENT,BOTH PMN AND MONONUCLEAR NO ORGANISMS SEEN CYTOSPIN SMEAR    Culture   Final    NO GROWTH 1 DAY Performed at [facility], [address], [place], [place] [address]    Report Status PENDING  Incomplete  C difficile quick scan w PCR reflex     Status: None   Collection Time: 05/06/18  8:24 PM  Result Value Ref Range Status   C Diff antigen NEGATIVE NEGATIVE Final   C Diff toxin NEGATIVE NEGATIVE Final   C Diff interpretation No C. difficile detected.  Final    Comment: Performed at [facility], [address] [place], [place] [address]    Studies/Results: No  results found.   Assessment/Plan: Meningitis Hypogammaglobulinemia  Total days of antibiotics: 1 vanco/merrem/valtrex  CSF Cx is ngtd Serum Crypto ag is (-) Appreciate heme eval, assistance with dosing IVIg HSV PCR is (-), will stop valtrex Continue her anbx while we await her CSF Cx.  If she wishes to leave before Cx final, could giver her levaquin po for 1  week.  ID available as needed over w/e.          Johny Sax MD, FACP Infectious Diseases (pager) [phone] [email or web address] 05/07/2018, 1:05 PM  LOS: 0 days

## 2018-05-07 NOTE — Consult Note (Signed)
[facility] Inpatient Hematology/Oncology Initial Consultation  Patient Name:  Melinda Gray DOB: 02/11/1996  Date of Service: May 07, 2018  Referring Provider: Roselind Messier MD  Consulting Physician: Toni Arthurs MD Hematology/Oncology  Reason for Referral: In the setting of previously diagnosed primary immunodeficiency disorder; on subcutaneous gammaglobulin every 2 weeks while residing in [place]. [place] until June, she presents now with presumptive aseptic meningitis for consideration of intravenous gammaglobulin.  History Present Illness: Melinda Gray is a 22 year old resident of [place], originally from [place] whose past medical history is significant for primary immunodeficiency disorder, identified at the age of 15 years.  She was followed previously by Dr. Lottie Dawson, immunology [phone]) in [place].  She is a former Horticulturist, commercial and has no other significant medical problems with the exception of bronchial asthma, temporomandibular joint syndrome, chronic migraine headaches, and former hip surgery in 2014 with persistent, but episodic, pain which will eventually necessitate at least a partial arthroplasty.  She has been seen previously at [facility], orthopedic surgery.  She has no primary care physician at present.  She relocated in June and has not identified a Conservator, museum/gallery.  Although she has been getting biweekly subcutaneous gammaglobulin (16 mg), her last at-home injection was in June. She was well until 7 days prior to her admission when she developed deep erythema over the face and upper chest without pain or pruritus which resolved spontaneously after 24 hours.  Several days later, she developed acute onset of headache, followed by fever and chills, dry cough, extreme fatigue, sore throat, and transient loose stool.  Naprosyn appeared to benefit.  When she developed severe frontal headache, photophobia, and neck  stiffness, which started the day before presentation, she sought consultation in the emergency department at [facility].  On October 23 a lumbar puncture was performed.  Those results are detailed below.  She has been seen and evaluated by Dr. Johny Sax, infectious disease.  See Dr. Moshe Cipro consultation for details.  It is with this background she presents now for further diagnostic and therapeutic recommendations in the setting of primary immune deficiency disorder now with suspected aseptic meningitis as outlined above.  Past Medical History: Bronchial asthma Primary immunodeficiency syndrome Temporomandibular joint Chronic right hip pain Migraine headaches  Surgical History: March, 2014: Right hip surgery March, 2011 left foot surgery for bone spurs  Gynecologic History: Her menarche was at age 19 years. Her last menses was 1 month earlier. Her menses have been regular every 28 days.   She is a gravida 1 para 0 miscarriage 1 She was on oral contraception from the age of 32 to 39 years. For 2 years she received Depo-Provera For 3 years she was on the Mirena ring. Since May, 2019 she has been on the Nuvo ring. Her last Pap smear was in May 2019, reportedly normal.  Family History: Mother: Age 77 years: Gestational diabetes mellitus Father: Age 17 years: Barron Schmid disease/HTN She has 1 brother with no medical problems.  Social History: Melinda Gray is single, never married. She has no children. She is a lifetime non-smoker. Her alcohol intake is infrequent, never heavy.  Transfusion History: No prior transfusion  Exposure History: She has no known exposure to toxic chemicals, radiation, or pesticides.  Allergies:  Amoxicillin Augmentin  Current Medications: Anti-infectives (From admission, onward)   Start     Dose/Rate Route Frequency Ordered Stop   05/06/18 1600  valACYclovir (VALTREX) tablet 1,000 mg  1,000 mg Oral 3 times daily 05/06/18  1456     05/06/18 1600  meropenem (MERREM) 2 g in sodium chloride 0.9 % 100 mL IVPB     2 g 200 mL/hr over 30 Minutes Intravenous Every 8 hours 05/06/18 1501     05/06/18 1100  acyclovir (ZOVIRAX) 600 mg in dextrose 5 % 100 mL IVPB     600 mg 112 mL/hr over 60 Minutes Intravenous  Once 05/06/18 1017 05/06/18 1200   05/06/18 0600  sulfamethoxazole-trimethoprim (BACTRIM) 320 mg in dextrose 5 % 500 mL IVPB  Status:  Discontinued     320 mg 346.7 mL/hr over 90 Minutes Intravenous Every 8 hours 05/06/18 0537 05/06/18 1456   05/06/18 0600  vancomycin (VANCOCIN) IVPB 1000 mg/200 mL premix     1,000 mg 200 mL/hr over 60 Minutes Intravenous Every 12 hours 05/06/18 0544     05/06/18 0230  acyclovir (ZOVIRAX) 610 mg in dextrose 5 % 100 mL IVPB     10 mg/kg  61.2 kg 112.2 mL/hr over 60 Minutes Intravenous  Once 05/06/18 0218 05/06/18 0544   05/05/18 1945  sulfamethoxazole-trimethoprim (BACTRIM) 300 mg in dextrose 5 % 250 mL IVPB     300 mg 268.8 mL/hr over 60 Minutes Intravenous  Once 05/05/18 1938 05/05/18 2202   05/05/18 1945  vancomycin (VANCOCIN) IVPB 1000 mg/200 mL premix     1,000 mg 200 mL/hr over 60 Minutes Intravenous  Once 05/05/18 1938 05/05/18 2242     Review of Systems: Constitutional: Prior fever and shaking chills.  Mild appetite without weight deficit.  Extreme fatigue, now improved. Skin: No current rash, scaling, sores, lumps, or jaundice. HEENT: No visual changes or hearing deficit; TMJ. Pulmonary: Prior dry cough and sore throat, now improved.  Bronchial asthma.  No orthopnea. Breasts: No complaints. Cardiovascular:  No coronary artery disease, angina, or myocardial infarction.  No cardiac dysrhythmia, essential hypertension, or dyslipidemia. Gastrointestinal: No indigestion, dysphagia, abdominal pain, diarrhea, or constipation.  No change in bowel habits.  No nausea or vomiting area no melena or bright red blood per rectum. Genitourinary: No urinary  frequency, urgency, hematuria, or dysuria. Musculoskeletal: Degenerative disease within the right hip.  No new arthralgias or myalgias; no joint swelling, pain, or instability. Hematologic: No bleeding tendency or easy bruisability; primary immune deficiency disorder. Endocrine: No intolerance to heat or cold; no thyroid disease or diabetes mellitus. Vascular: No peripheral arterial or venous thromboembolic disease. Psychological: No anxiety, depression, or mood changes; no mental health illnesses. Neurological: No dizziness, lightheadedness, syncope, or near syncopal episodes; no numbness or tingling in the fingers or toes.  Physical Examination: Vital Signs in last 24 hours: Temp:  [98.4 F (36.9 C)-98.6 F (37 C)] 98.4 F (36.9 C) (10/25 0523) Pulse Rate:  [72-88] 77 (10/25 0523) Resp:  [14-16] 16 (10/25 0523) BP: (94-115)/(47-56) 108/52 (10/25 0523) SpO2:  [99 %-100 %] 99 % (10/25 0523) Constitutional: Melinda Gray is fully nourished and developed.  She looks age appropriate.  She is friendly and cooperative without respiratory compromise at rest. Skin: No rashes, scaling, dryness, jaundice, or itching. HEENT: Head is normocephalic and atraumatic.  Extraocular muscles are intact.  Pupils are equal round and reactive to light and accommodation.  No nystagmus is present.  Sclerae are anicteric.  Conjunctivae are pink.  No sinus tenderness nor oropharyngeal lesions.  Lips without cracking or peeling; tongue without mass, inflammation, or nodularity.  Mucous membranes are moist. Neck: Supple and symmetric.  No jugular venous distention or thyromegaly.  Trachea is midline. Lymphatics: No cervical or supraclavicular lymphadenopathy.  No epitrochlear, axillary, or inguinal lymphadenopathy is appreciated. Respiratory/chest: Thorax is symmetrical.  Breath sounds are clear to auscultation and percussion.  Normal excursion and respiratory effort. Back: Symmetric without deformity or  tenderness. Cardiovascular: Heart rate and rhythm are regular without murmurs or gallops. Gastrointestinal: Abdomen is soft, nontender; no organomegaly.  Bowel sounds are normoactive.  No masses are appreciated. Extremities: In the lower extremities, there is no asymmetric swelling, erythema, tenderness, or cord formation.  No clubbing, cyanosis, nor edema. Hematologic: No petechiae, hematomas, or ecchymoses. Psychological:  She is oriented to person, place, and time; normal affect, memory, and cognition. Neurological: There are no gross neurologic deficits.  Laboratory Results: I have reviewed the data as listed:  Ref Range & Units 06:01  WBC 4.0 - 10.5 K/uL 7.0   RBC 3.87 - 5.11 MIL/uL 3.41Low    Hemoglobin 12.0 - 15.0 g/dL 1.6XWR    HCT 60.4 - 54.0 % 29.7Low    MCV 80.0 - 100.0 fL 87.1   MCH 26.0 - 34.0 pg 27.6   MCHC 30.0 - 36.0 g/dL 98.1   RDW 19.1 - 47.8 % 12.5   Platelets 150 - 400 K/uL 211   nRBC 0.0 - 0.2 % 0.0   Neutrophils Relative % % 63   Neutro Abs 1.7 - 7.7 K/uL 4.4   Lymphocytes Relative % 24   Lymphs Abs 0.7 - 4.0 K/uL 1.7   Monocytes Relative % 8   Monocytes Absolute 0.1 - 1.0 K/uL 0.6   Eosinophils Relative % 4   Eosinophils Absolute 0.0 - 0.5 K/uL 0.3   Basophils Relative % 1   Basophils Absolute 0.0 - 0.1 K/uL 0.1   Immature Granulocytes % 0   Abs Immature Granulocytes 0.00 - 0.07 K/uL 0.02     Ref Range & Units 06:01  Sodium 135 - 145 mmol/L 140   Potassium 3.5 - 5.1 mmol/L 3.5   Chloride 98 - 111 mmol/L 113High    CO2 22 - 32 mmol/L 20Low    Glucose, Bld 70 - 99 mg/dL 96   BUN 6 - 20 mg/dL <2NFA    Creatinine, Ser 0.44 - 1.00 mg/dL 2.13   Calcium 8.9 - 08.6 mg/dL 5.7QIO    GFR calc non Af Amer >60 mL/min >60   GFR calc Af Amer >60 mL/min >60     Ref Range & Units 15:46  Total Protein 6.5 - 8.1 g/dL 6.7   Albumin 3.5 - 5.0 g/dL 9.6EXB    AST 15 - 41 U/L 12Low    ALT 0 - 44 U/L 12   Alkaline Phosphatase 38 - 126 U/L 41   Total Bilirubin 0.3 -  1.2 mg/dL 0.3   Bilirubin, Direct 0.0 - 0.2 mg/dL 0.1   Indirect Bilirubin 0.3 - 0.9 mg/dL 2.8UXL    C. difficile cytotoxin: Negative Cryptococcal antigen: Negative HIV screen: Nonreactive CSF culture:  WBC present both PMN and mononuclear cells No organisms seen. No growth; 1 day. Herpes simplex virus: HSV 1 DNA: Negative HSV 2 DNA: Negative CSF protein 21 CSF glucose 54  Summary/Assessment: Since the age of 65 years, she was diagnosed with primary immunodeficiency disorder.  She was followed previously by Dr. Lottie Dawson, immunology [phone]) in [place]. Although she has been getting biweekly subcutaneous gammaglobulin (16 mg), her last at-home injection was in June.    She was well until 7 days prior to her admission when she developed deep erythema over  the face and upper chest without pain or pruritus which resolved spontaneously after 24 hours.  Several days later, she developed acute onset of headache, followed by fever and chills, dry cough, extreme fatigue and sore throat, and transient loose stool.  Naprosyn appeared to benefit.   When she developed severe frontal headache, photophobia, and neck stiffness, which started the day before presentation, she sought consultation in the emergency department at [facility].  On October 23 a lumbar puncture was performed.  Those results are detailed below.  She has been seen and evaluated by Dr. Johny Sax, infectious disease.  See Dr. Moshe Cipro consultation for details.   Her other comorbid problems include bronchial asthma, temporomandibular joint syndrome, chronic migraine headaches, and former hip surgery in 2014 with persistent but episodic pain which will eventually necessitate at least a partial arthroplasty.  She has been seen previously at [facility], orthopedic surgery.   Recommendation/Plan: Although it has appear that she has improved significantly since starting broad-spectrum antimicrobial  therapy, intravenous gammaglobulin was recommended given the potentially life threatening aseptic meningitis.  Although laboratory studies were requested to include quantitative immunoglobulins, those results were not available at the time of dictation.  Intravenous gammaglobulin: 400 mg/kg intravenously will be given daily for 2 days.  If she continues to improve, a trough will be obtained on day 3.  Every effort will be made to obtain both a primary care physician and immunologist prior to discharge.  If necessary however, she can be seen in the outpatient setting following her discharge.  The total time spent discussing her prior history, role and rationale for intravenous gammaglobulin given the setting of aseptic meningitis, potential risks and side effects with recommendations was 60 minutes.  At least 50% of that time was spent in discussion, reviewing outside records, laboratory evaluation, counseling, and answering questions.   This note was dictated using voice activated technology/software.  Unfortunately, typographical errors are not uncommon, and transcription is subject to mistakes and regrettably misinterpretation.  If necessary, clarification of the above information can be discussed with me at any time.  Thank you Dr. Renford Dills  for allowing my participation in the care of Melinda Gray.  Dr. should not will be covering for me on October 26.  Please do not hesitate to call should any questions arise regarding this initial consultation and discussion.  FOLLOW UP: AS DIRECTED   Debbe Odea, MD Hematology/Oncology [facility] [address] [place], [place] [address] Office: [phone] Main: [phone]

## 2018-05-08 DIAGNOSIS — D849 Immunodeficiency, unspecified: Secondary | ICD-10-CM

## 2018-05-08 DIAGNOSIS — G030 Nonpyogenic meningitis: Principal | ICD-10-CM

## 2018-05-08 LAB — HEPATITIS B SURFACE ANTIGEN: Hepatitis B Surface Ag: NEGATIVE

## 2018-05-08 LAB — HEPATITIS B CORE ANTIBODY, IGM: Hep B C IgM: NEGATIVE

## 2018-05-08 LAB — HEPATITIS C ANTIBODY: HCV Ab: <0.1 s/co ratio (ref 0.0–0.9)

## 2018-05-08 MED ORDER — LORATADINE 10 MG PO TABS
10.0000 mg | ORAL_TABLET | Freq: Every day | ORAL | Status: DC
  Administered 2018-05-08 – 2018-05-09 (×3): 10 mg via ORAL
  Filled 2018-05-08 (×3): qty 1

## 2018-05-08 MED ORDER — HYDROCORTISONE 0.5 % EX CREA
TOPICAL_CREAM | Freq: Four times a day (QID) | CUTANEOUS | Status: DC
  Administered 2018-05-08: 10:00:00 via TOPICAL
  Administered 2018-05-08: 1 via TOPICAL
  Administered 2018-05-08 – 2018-05-09 (×6): via TOPICAL
  Filled 2018-05-08: qty 28.35

## 2018-05-08 NOTE — Progress Notes (Signed)
IP PROGRESS NOTE  Subjective:   Events noted overnight.  She received IVIG without any major complications.  He did report some irritation around her IV that was replaced without incident.  She denies any infusion related complications including pruritus, hives or shortness of breath.  She does report some minor headaches but for the most part feeling much better.  Objective:  Vital signs in last 24 hours: Temp:  [97.8 F (36.6 C)-98.4 F (36.9 C)] 98.3 F (36.8 C) (10/26 0447) Pulse Rate:  [59-78] 78 (10/26 0447) Resp:  [14-20] 18 (10/26 0447) BP: (108-114)/(60-68) 110/60 (10/26 0447) SpO2:  [99 %-100 %] 99 % (10/26 0447) Weight change:  Last BM Date: 05/07/18  Intake/Output from previous day: 10/25 0701 - 10/26 0700 In: 300 [P.O.:300] Out: -  General: Alert, awake without distress. Head: Normocephalic atraumatic. Mouth: mucous membranes moist, pharynx normal without lesions Eyes: No scleral icterus.  Pupils are equal and round reactive to light. Resp: clear to auscultation bilaterally without rhonchi or wheezes or dullness to percussion. Cardio: regular rate and rhythm, S1, S2 normal, no murmur, click, rub or gallop GI: soft, non-tender; bowel sounds normal; no masses,  no organomegaly Musculoskeletal: No joint deformity or effusion. Neurological: No motor, sensory deficits.  Intact deep tendon reflexes. Skin: No rashes or lesions.    Lab Results: Recent Labs    05/06/18 0556 05/07/18 0601  WBC 12.1* 7.0  HGB 9.7* 9.4*  HCT 29.9* 29.7*  PLT 213 211    BMET Recent Labs    05/06/18 0556 05/07/18 0601  NA 140 140  K 3.2* 3.5  CL 113* 113*  CO2 21* 20*  GLUCOSE 100* 96  BUN 6 <5*  CREATININE 0.75 0.65  CALCIUM 7.7* 8.0*    Studies/Results: No results found.  Medications: I have reviewed the patient's current medications.  Assessment/Plan:  22 year old woman with the following:  1.  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  She was getting this therapy while living in [place] on a regular basis subcutaneously.  Given her acute illness, she is receiving IVIG at total of 25 g and tolerated the first day on 05/07/2018.  The plan is to proceed with day 2 out of a planned 3 days.  She is agreeable to proceed.  2.  Aseptic meningitis: Currently on broad-spectrum antibiotics with infectious disease following.  Appears to be clinically better at this time.  Her culture remains pending.  3.  Disposition: I have no objections to discharge if she is stable in the next 24 hours.  I urged her to establish care with immunology specialist at [facility] or [facility]  25  minutes was spent with the patient face-to-face today.  More than 50% of time was dedicated to reviewing laboratory data, the natural course of her disease, and future treatment plan.    LOS: 1 day   Eli Hose 05/08/2018, 8:10 AM

## 2018-05-08 NOTE — Progress Notes (Addendum)
PROGRESS NOTE    Melinda Gray  ZOX:[id number] DOB: [birth year] DOA: 05/05/2018 PCP: Patient, No Pcp Per   Brief Narrative: Patient is a 22 year old female with past medical history of primary immunodeficiency disorder who takes subcutaneous Ig injection every other weeks who presented to the emergency department with complaints of persistent headache, fever and chills along with generalized body ache.  Also reported of photophobia, sore throat and neck pain.  Since she was immunocompromised, there was concern for meningitis versus encephalitis and she was started on broad-spectrum antibiotics.ID was following.Hematology also consulted  for need of IVIG.  Assessment & Plan:   Principal Problem:   Aseptic meningitis Active Problems:   SIRS (systemic inflammatory response syndrome) (HCC)   Primary immune deficiency disorder (HCC)  Possible aseptic Meningitis: Started on empiric antibiotics with meropenem, vancomycin and Valtrex because of her immunocompromised state.  ID was following.  Patient presented with fever, neck rigidity.  These symptoms have resolved.   HSV PCR came out to be negative so Valtrex has been discontinued Lumbar puncture was done on presentation.  It was a bloody tap showing 5000 RBCs which corresponds to 10 WBCs.  CSF white cell count was just 10 so there may not be significant white cell count present on CSF itself.  Protein was normal.Culture and Gram stain has been sent.  Gram stain did not show any bacteria but showed both polynuclear and mononuclear cells.  We will follow the culture.NGTD ID has sent fungal serologies.Cyrptococal Ag titre negative. If culture remains negative till tomorrow, most likely she can be  discharged to home.Since tomorrow will be day 3 of the negative cultures,she can be discharged on oral Levaquin as suggested by ID(Can make a phone call to ID tomorrow before DC)  Primary immunodeficiency disorder: Was taking IgG injection every other week  subcutaneously while she was at [facility] [place].  She has not taken it for last few months since he moved here in June 2019.  We  requested for hematology consultation .  She has been started on daily  IVIG.  Today is day 2.  Plan is to continue till tomorrow. She needs to follow-up with immunology as an outpatient likely at [facility].  Likely this can be done thorugh the referral of her PCP.  DVT prophylaxis: SCD Code Status: Full Family Communication: Discussed with mother at bed side Disposition Plan: DC tomorrow  if cultures remain negative  Consultants: ID, hematology  Procedures: None  Antimicrobials: Merrem day 3, vancomycin day 4  Subjective: Patient seen and examined the bedside this morning.  Hemodynamically stable.  Complains of mild headache but denies any nausea, vomiting, photophobia or neck pain.Comfortable.  Objective: Vitals:   05/07/18 1342 05/07/18 1832 05/07/18 1946 05/08/18 0447  BP: 114/61 111/65 108/68 110/60  Pulse: 69 70 (!) 59 78  Resp: 14 16 20 18   Temp: 98.3 F (36.8 C) 98.4 F (36.9 C) 97.8 F (36.6 C) 98.3 F (36.8 C)  TempSrc: Oral Oral Oral Oral  SpO2: 100% 100% 100% 99%  Weight:      Height:        Intake/Output Summary (Last 24 hours) at 05/08/2018 0932 Last data filed at 05/07/2018 1749 Gross per 24 hour  Intake 300 ml  Output -  Net 300 ml   Filed Weights   05/05/18 1638  Weight: 61.2 kg    Examination:  General exam: Appears calm and comfortable ,Not in distress,average built HEENT:PERRL,Oral mucosa moist, Ear/Nose normal on gross exam,No neck rigidity  Respiratory system: Bilateral equal air entry, normal vesicular breath sounds, no wheezes or crackles  Cardiovascular system: S1 & S2 heard, RRR. No JVD, murmurs, rubs, gallops or clicks. No pedal edema. Gastrointestinal system: Abdomen is nondistended, soft and nontender. No organomegaly or masses felt. Normal bowel sounds heard. Central nervous system: Alert and oriented. No focal  neurological deficits. Extremities: No edema, no clubbing ,no cyanosis, distal peripheral pulses palpable. Skin: No rashes, lesions or ulcers,no icterus ,no pallor MSK: Normal muscle bulk,tone ,power Psychiatry: Judgement and insight appear normal. Mood & affect appropriate.     Data Reviewed: I have personally reviewed following labs and imaging studies  CBC: Recent Labs  Lab 05/05/18 1803 05/06/18 0556 05/07/18 0601  WBC 20.7* 12.1* 7.0  NEUTROABS 18.8* 9.8* 4.4  HGB 12.7 9.7* 9.4*  HCT 38.9 29.9* 29.7*  MCV 85.7 86.2 87.1  PLT 266 213 211   Basic Metabolic Panel: Recent Labs  Lab 05/05/18 1803 05/06/18 0556 05/07/18 0601  NA 137 140 140  K 3.6 3.2* 3.5  CL 103 113* 113*  CO2 24 21* 20*  GLUCOSE 92 100* 96  BUN 7 6 <5*  CREATININE 0.67 0.75 0.65  CALCIUM 9.0 7.7* 8.0*   GFR: Estimated Creatinine Clearance: 98.4 mL/min (by C-G formula based on SCr of 0.65 mg/dL). Liver Function Tests: Recent Labs  Lab 05/05/18 1803 05/07/18 1546  AST 14* 12*  ALT 12 12  ALKPHOS 46 41  BILITOT 0.7 0.3  PROT 7.8 6.7  ALBUMIN 3.9 3.4*   No results for input(s): LIPASE, AMYLASE in the last 168 hours. No results for input(s): AMMONIA in the last 168 hours. Coagulation Profile: No results for input(s): INR, PROTIME in the last 168 hours. Cardiac Enzymes: No results for input(s): CKTOTAL, CKMB, CKMBINDEX, TROPONINI in the last 168 hours. BNP (last 3 results) No results for input(s): PROBNP in the last 8760 hours. HbA1C: No results for input(s): HGBA1C in the last 72 hours. CBG: No results for input(s): GLUCAP in the last 168 hours. Lipid Profile: No results for input(s): CHOL, HDL, LDLCALC, TRIG, CHOLHDL, LDLDIRECT in the last 72 hours. Thyroid Function Tests: No results for input(s): TSH, T4TOTAL, FREET4, T3FREE, THYROIDAB in the last 72 hours. Anemia Panel: No results for input(s): VITAMINB12, FOLATE, FERRITIN, TIBC, IRON, RETICCTPCT in the last 72 hours. Sepsis  Labs: Recent Labs  Lab 05/05/18 1823 05/05/18 2245  LATICACIDVEN 1.03 1.90    Recent Results (from the past 240 hour(s))  Urine culture     Status: Abnormal   Collection Time: 05/05/18  6:01 PM  Result Value Ref Range Status   Specimen Description   Final    URINE, CLEAN CATCH Performed at [facility], [address], [place], [place] [address]    Special Requests   Final    NONE Performed at [facility], [address] [place], [place] [address]    Culture MULTIPLE SPECIES PRESENT, SUGGEST RECOLLECTION (A)  Final   Report Status 05/07/2018 FINAL  Final  Culture, blood (routine x 2)     Status: None (Preliminary result)   Collection Time: 05/05/18  6:10 PM  Result Value Ref Range Status   Specimen Description   Final    BLOOD LEFT ANTECUBITAL Performed at [facility], [address] [place], [place] [address]    Special Requests   Final    BOTTLES DRAWN AEROBIC AND ANAEROBIC Blood Culture results may not be optimal due to an excessive volume of blood  received in culture bottles Performed at [facility], [address], [place], [place] [address]    Culture   Final    NO GROWTH 2 DAYS Performed at [facility], [address] [place], [place] [address]    Report Status PENDING  Incomplete  Culture, blood (routine x 2)     Status: None (Preliminary result)   Collection Time: 05/05/18  6:11 PM  Result Value Ref Range Status   Specimen Description   Final    BLOOD RIGHT ANTECUBITAL Performed at [facility], [address], [place], [place] [address]    Special Requests   Final    BOTTLES DRAWN AEROBIC AND ANAEROBIC Blood Culture results may not be optimal due to an excessive volume of blood received in culture bottles Performed at [facility], [address] [place], [place] [address]    Culture   Final    NO GROWTH 2 DAYS Performed at [facility], [address], [place], [place] [address]    Report Status PENDING  Incomplete  CSF culture     Status: None (Preliminary result)   Collection Time: 05/05/18  8:29 PM  Result Value Ref Range Status   Specimen Description   Final    CSF Performed at [facility], [address], [place], [place] [address]    Special Requests   Final    NONE Performed at [facility], [address], [place], [place] [address]    Gram Stain   Final    WBC PRESENT,BOTH PMN AND MONONUCLEAR NO ORGANISMS SEEN CYTOSPIN SMEAR    Culture   Final    NO GROWTH 2 DAYS Performed at [facility], [address] [place], [place] [address]    Report Status PENDING  Incomplete  C difficile quick scan w PCR reflex     Status: None   Collection Time: 05/06/18  8:24 PM  Result Value Ref Range Status   C Diff antigen NEGATIVE NEGATIVE Final   C Diff toxin NEGATIVE NEGATIVE Final   C Diff interpretation No C. difficile detected.  Final    Comment: Performed at [facility], [address] [place], [place] [address]         Radiology Studies: No results found.      Scheduled Meds: . hydrocortisone cream   Topical QID  . loratadine  10 mg Oral Daily   Continuous Infusions: . IMMUNE GLOBULIN 10% (HUMAN) IV - For Fluid Restriction Only    . meropenem (MERREM) IV 2 g (05/08/18 0915)  . vancomycin 1,000 mg (05/08/18 0524)     LOS: 1 day    Time spent: 25 mins.More than 50% of that time was spent in counseling and/or coordination of care.      Burnadette Pop, MD [facility] Pager [phone]  If 7PM-7AM, please contact night-coverage [email or web address] Password [facility] 05/08/2018, 9:32 AM

## 2018-05-09 DIAGNOSIS — H53149 Visual discomfort, unspecified: Secondary | ICD-10-CM

## 2018-05-09 DIAGNOSIS — R6889 Other general symptoms and signs: Secondary | ICD-10-CM

## 2018-05-09 DIAGNOSIS — R51 Headache: Secondary | ICD-10-CM

## 2018-05-09 LAB — BASIC METABOLIC PANEL
Anion gap: 9 (ref 5–15)
BUN: 5 mg/dL — ABNORMAL LOW (ref 6–20)
CHLORIDE: 104 mmol/L (ref 98–111)
CO2: 26 mmol/L (ref 22–32)
Calcium: 8.6 mg/dL — ABNORMAL LOW (ref 8.9–10.3)
Creatinine, Ser: 0.55 mg/dL (ref 0.44–1.00)
GFR calc non Af Amer: >60 mL/min (ref >60)
Glucose, Bld: 90 mg/dL (ref 70–99)
POTASSIUM: 3.3 mmol/L — AB (ref 3.5–5.1)
SODIUM: 139 mmol/L (ref 135–145)

## 2018-05-09 LAB — CBC WITH DIFFERENTIAL/PLATELET
ABS IMMATURE GRANULOCYTES: 0.01 10*3/uL (ref 0.00–0.07)
BASOS ABS: 0 10*3/uL (ref 0.0–0.1)
Basophils Relative: 1 %
Eosinophils Absolute: 0.3 10*3/uL (ref 0.0–0.5)
Eosinophils Relative: 7 %
HCT: 34.4 % — ABNORMAL LOW (ref 36.0–46.0)
HEMOGLOBIN: 11.2 g/dL — AB (ref 12.0–15.0)
Immature Granulocytes: 0 %
LYMPHS ABS: 1.1 10*3/uL (ref 0.7–4.0)
LYMPHS PCT: 28 %
MCH: 27.7 pg (ref 26.0–34.0)
MCHC: 32.6 g/dL (ref 30.0–36.0)
MCV: 85.1 fL (ref 80.0–100.0)
MONO ABS: 0.4 10*3/uL (ref 0.1–1.0)
Monocytes Relative: 11 %
Neutro Abs: 2.1 10*3/uL (ref 1.7–7.7)
Neutrophils Relative %: 53 %
Platelets: 289 10*3/uL (ref 150–400)
RBC: 4.04 MIL/uL (ref 3.87–5.11)
RDW: 12.1 % (ref 11.5–15.5)
WBC: 4 10*3/uL (ref 4.0–10.5)
nRBC: 0 % (ref 0.0–0.2)

## 2018-05-09 LAB — CSF CULTURE W GRAM STAIN: Culture: NO GROWTH

## 2018-05-09 LAB — FUNGAL ANTIBODIES PANEL, ID-BLOOD
ASPERGILLUS FLAVUS: NEGATIVE
Aspergillus fumigatus, IgG: NEGATIVE
Aspergillus niger: NEGATIVE
Blastomyces Abs, Qn, DID: NEGATIVE
HISTOPLASMA AB ID: NEGATIVE

## 2018-05-09 LAB — CSF CULTURE

## 2018-05-09 LAB — HISTOPLASMA ANTIGEN, URINE: Histoplasma Antigen, urine: <0.5 (ref <0.5)

## 2018-05-09 MED ORDER — MAGNESIUM SULFATE 2 GM/50ML IV SOLN
2.0000 g | Freq: Once | INTRAVENOUS | Status: AC
  Administered 2018-05-09: 2 g via INTRAVENOUS
  Filled 2018-05-09: qty 50

## 2018-05-09 MED ORDER — IMMUNE GLOBULIN (HUMAN) 10 GM/100ML IV SOLN
400.0000 mg/kg | Freq: Once | INTRAVENOUS | Status: DC
  Filled 2018-05-09: qty 300

## 2018-05-09 MED ORDER — IMMUNE GLOBULIN (HUMAN) 10 GM/100ML IV SOLN
400.0000 mg/kg | Freq: Once | INTRAVENOUS | Status: AC
  Administered 2018-05-09: 25 g via INTRAVENOUS
  Filled 2018-05-09: qty 300

## 2018-05-09 MED ORDER — POTASSIUM CHLORIDE CRYS ER 20 MEQ PO TBCR
40.0000 meq | EXTENDED_RELEASE_TABLET | Freq: Once | ORAL | Status: AC
  Administered 2018-05-09: 40 meq via ORAL
  Filled 2018-05-09: qty 2

## 2018-05-09 NOTE — Progress Notes (Signed)
Pt IVIG titrated up to 73.64mL/h. Pt states that is all she can tolerate.  Justin Mend, RN

## 2018-05-09 NOTE — Progress Notes (Signed)
IP PROGRESS NOTE  Subjective:   Melinda Gray reports no major changes overnight.  She does report mild headaches but no fevers or altered mental status.  She tolerated IVIG infusion without any complications at this time.  She denies any infusion related reaction, pruritus or dyspnea on exertion.  She denies any changes in her mentation or GI distress.  Objective:  Vital signs in last 24 hours: Temp:  [97.7 F (36.5 C)-98.8 F (37.1 C)] 98.6 F (37 C) (10/27 0636) Pulse Rate:  [63-84] 63 (10/27 0636) Resp:  [18-20] 18 (10/27 0636) BP: (94-102)/(44-68) 94/44 (10/27 0636) SpO2:  [98 %-99 %] 99 % (10/27 0636) Weight change:  Last BM Date: 05/08/18  Intake/Output from previous day: 10/26 0701 - 10/27 0700 In: 891.2 [P.O.:480; I.V.:151.5; IV Piggyback:259.7] Out: -    General appearance: Comfortable appearing without any discomfort Head: Normocephalic without any trauma Oropharynx: Mucous membranes are moist and pink without any thrush or ulcers. Eyes: Pupils are equal and round reactive to light. Lymph nodes: No cervical, supraclavicular, inguinal or axillary lymphadenopathy.   Heart:regular rate and rhythm.  S1 and S2 without leg edema. Lung: Clear without any rhonchi or wheezes.  No dullness to percussion. Abdomin: Soft, nontender, nondistended with good bowel sounds.  No hepatosplenomegaly. Musculoskeletal: No joint deformity or effusion.  Full range of motion noted. Neurological: No deficits noted on motor, sensory and deep tendon reflex exam. Skin: No petechial rash or dryness.  Appeared moist.  Psychiatric: Mood and affect appeared appropriate. s.    Lab Results: Recent Labs    05/07/18 0601 05/09/18 0559  WBC 7.0 4.0  HGB 9.4* 11.2*  HCT 29.7* 34.4*  PLT 211 289    BMET Recent Labs    05/07/18 0601 05/09/18 0559  NA 140 139  K 3.5 3.3*  CL 113* 104  CO2 20* 26  GLUCOSE 96 90  BUN <5* 5*  CREATININE 0.65 0.55  CALCIUM 8.0* 8.6*     Studies/Results: No results found.  Medications: I have reviewed the patient's current medications.  Assessment/Plan:  22 year old woman with the following:  1.  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  She will establish follow-up with immunology upon her discharge.  She will receive day 3 of IVIG today without any dose reduction or delay.  Risks and benefits of this approach was reviewed again and she is agreeable to proceed.  She will receive 400 mg/kg today that will and her IVIG treatment plan.  2.  Aseptic meningitis: She remains on a broad-spectrum antibiotics with clinical improvement.  Management per primary team and infectious disease  3.  Disposition: No objections to discharge from hematology standpoint after IVIG infusion.  25  minutes was spent with the patient face-to-face today.  More than 50% of time was dedicated to discussing her disease status, risks and benefits of immunoglobulin treatment, long-term complications associated with it and plan of care.    LOS: 2 days   Eli Hose 05/09/2018, 8:25 AM

## 2018-05-09 NOTE — Progress Notes (Signed)
ID PROGRESS NOTE  CSF cultures are showing no growth to date. I have discontinued IV abtx. No need for further abtx. Likely aseptic/viral meningitis. No need for further abtx.

## 2018-05-09 NOTE — Progress Notes (Signed)
IP PROGRESS NOTE  Subjective:   Ms. Goren reports no major changes overnight.  She does report mild headaches but no fevers or altered mental status.  She tolerated IVIG infusion without any complications at this time.  She denies any infusion related reaction, pruritus or dyspnea on exertion.  She denies any changes in her mentation or GI distress.  Objective:  Vital signs in last 24 hours: Temp:  [97.7 F (36.5 C)-98.8 F (37.1 C)] 98.6 F (37 C) (10/27 0636) Pulse Rate:  [63-84] 63 (10/27 0636) Resp:  [18-20] 18 (10/27 0636) BP: (94-102)/(44-68) 94/44 (10/27 0636) SpO2:  [98 %-99 %] 99 % (10/27 0636) Weight change:  Last BM Date: 05/08/18  Intake/Output from previous day: 10/26 0701 - 10/27 0700 In: 891.2 [P.O.:480; I.V.:151.5; IV Piggyback:259.7] Out: -    General appearance: Comfortable appearing without any discomfort Head: Normocephalic without any trauma Oropharynx: Mucous membranes are moist and pink without any thrush or ulcers. Eyes: Pupils are equal and round reactive to light. Lymph nodes: No cervical, supraclavicular, inguinal or axillary lymphadenopathy.   Heart:regular rate and rhythm.  S1 and S2 without leg edema. Lung: Clear without any rhonchi or wheezes.  No dullness to percussion. Abdomin: Soft, nontender, nondistended with good bowel sounds.  No hepatosplenomegaly. Musculoskeletal: No joint deformity or effusion.  Full range of motion noted. Neurological: No deficits noted on motor, sensory and deep tendon reflex exam. Skin: No petechial rash or dryness.  Appeared moist.  Psychiatric: Mood and affect appeared appropriate. s.    Lab Results: Recent Labs    05/07/18 0601 05/09/18 0559  WBC 7.0 4.0  HGB 9.4* 11.2*  HCT 29.7* 34.4*  PLT 211 289    BMET Recent Labs    05/07/18 0601 05/09/18 0559  NA 140 139  K 3.5 3.3*  CL 113* 104  CO2 20* 26  GLUCOSE 96 90  BUN <5* 5*  CREATININE 0.65 0.55  CALCIUM 8.0* 8.6*     Studies/Results: No results found.  Medications: I have reviewed the patient's current medications.  Assessment/Plan:  22 year old woman with the following:  1.  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  She will establish follow-up with immunology upon her discharge.  She will receive day 3 of IVIG today without any dose reduction or delay.  Risks and benefits of this approach was reviewed again and she is agreeable to proceed.  She will receive 400 mg/kg today that will and her IVIG treatment plan.  2.  Aseptic meningitis: She remains on a broad-spectrum antibiotics with clinical improvement.  Management per primary team and infectious disease  3.  Disposition: No objections to discharge from hematology standpoint after IVIG infusion.  25  minutes was spent with the patient face-to-face today.  More than 50% of time was dedicated to discussing her disease status, risks and benefits of immunoglobulin treatment, long-term complications associated with it and plan of care.    LOS: 2 days   Eli Hose 05/09/2018, 8:21 AM

## 2018-05-09 NOTE — Progress Notes (Signed)
PROGRESS NOTE  Melinda Gray WUJ:[id number] DOB: 04-01-1996 DOA: 05/05/2018 PCP: Patient, No Pcp Per  HPI/Recap of past 24 hours: Patient is a 22 year old female with past medical history of primary immunodeficiency disorder who takes subcutaneous Ig injection every other weeks who presented to the emergency department with complaints of persistent headache, fever and chills along with generalized body ache. Also reported of photophobia, sore throat and neck pain. Since she was immunocompromised, there was concern for meningitis versus encephalitis and she was started on broad-spectrum antibiotics.ID was following.Hematology also consulted  for need of IVIG.   05/09/2018: Patient seen and examined with her mother at bedside.  Hypotensive this morning with map of 59.  Last IVIG infusion planned today.  We will closely monitor her blood pressure during and post IVIG infusion.  Reports mild headache and mild neck stiffness 1 out of 10, much improved from yesterday.  Assessment/Plan: Principal Problem:   Aseptic meningitis Active Problems:   SIRS (systemic inflammatory response syndrome) (HCC)   Primary immune deficiency disorder (HCC)  Aseptic/viral meningitis CSF cultures no growth to date Antibiotics DC'd by ID No need for further antibiotics  Primary immunodeficiency disorder Was taking IVIG every other week lost to follow-up after moving from another state Has received 2/3 IVIG infusions Third IVIG infusion plan today Advised to follow-up with an immunologist posthospitalization which will be referred by her PCP Does not have a PCP however mother is wanting referral to [place] at [facility], Dr. Betty Swaziland.  Hypotension Possibly secondary to IVIG infusion Map 59 Maintain map above 65 Close monitoring of vital signs during and post IVIG infusion today  Hypokalemia Potassium 3.3 Repleted with p.o. potassium 40 mEq once and also supplemented with IV magnesium 2 g  once   Code Status: Full code  Family Communication: Mother at bedside.  All questions answered to her satisfaction.  Disposition Plan: Home possibly tomorrow 05/10/2018.   Consultants:  ID  Oncology  Procedures:  Lumbar puncture  Antimicrobials:  None  DVT prophylaxis: SCDs   Objective: Vitals:   05/08/18 1811 05/08/18 1940 05/08/18 2116 05/09/18 0636  BP: 98/61 (!) 101/58 101/68 (!) 94/44  Pulse: 75 71 68 63  Resp: 18 18 20 18   Temp: 98.8 F (37.1 C) 97.7 F (36.5 C) 98.7 F (37.1 C) 98.6 F (37 C)  TempSrc: Oral Oral Oral Oral  SpO2: 99% 99% 99% 99%  Weight:      Height:        Intake/Output Summary (Last 24 hours) at 05/09/2018 1206 Last data filed at 05/09/2018 0314 Gross per 24 hour  Intake 651.18 ml  Output -  Net 651.18 ml   Filed Weights   05/05/18 1638  Weight: 61.2 kg    Exam:  . General: 22 y.o. year-old female well developed well nourished in no acute distress.  Alert and oriented x3. . Cardiovascular: Regular rate and rhythm with no rubs or gallops.  No thyromegaly or JVD noted.   Marland Kitchen Respiratory: Clear to auscultation with no wheezes or rales. Good inspiratory effort. . Abdomen: Soft nontender nondistended with normal bowel sounds x4 quadrants. . Musculoskeletal: No lower extremity edema. 2/4 pulses in all 4 extremities. . Skin: No ulcerative lesions noted or rashes, . Psychiatry: Mood is appropriate for condition and setting   Data Reviewed: CBC: Recent Labs  Lab 05/05/18 1803 05/06/18 0556 05/07/18 0601 05/09/18 0559  WBC 20.7* 12.1* 7.0 4.0  NEUTROABS 18.8* 9.8* 4.4 2.1  HGB 12.7 9.7* 9.4* 11.2*  HCT 38.9  29.9* 29.7* 34.4*  MCV 85.7 86.2 87.1 85.1  PLT 266 213 211 289   Basic Metabolic Panel: Recent Labs  Lab 05/05/18 1803 05/06/18 0556 05/07/18 0601 05/09/18 0559  NA 137 140 140 139  K 3.6 3.2* 3.5 3.3*  CL 103 113* 113* 104  CO2 24 21* 20* 26  GLUCOSE 92 100* 96 90  BUN 7 6 <5* 5*  CREATININE 0.67 0.75  0.65 0.55  CALCIUM 9.0 7.7* 8.0* 8.6*   GFR: Estimated Creatinine Clearance: 98.4 mL/min (by C-G formula based on SCr of 0.55 mg/dL). Liver Function Tests: Recent Labs  Lab 05/05/18 1803 05/07/18 1546  AST 14* 12*  ALT 12 12  ALKPHOS 46 41  BILITOT 0.7 0.3  PROT 7.8 6.7  ALBUMIN 3.9 3.4*   No results for input(s): LIPASE, AMYLASE in the last 168 hours. No results for input(s): AMMONIA in the last 168 hours. Coagulation Profile: No results for input(s): INR, PROTIME in the last 168 hours. Cardiac Enzymes: No results for input(s): CKTOTAL, CKMB, CKMBINDEX, TROPONINI in the last 168 hours. BNP (last 3 results) No results for input(s): PROBNP in the last 8760 hours. HbA1C: No results for input(s): HGBA1C in the last 72 hours. CBG: No results for input(s): GLUCAP in the last 168 hours. Lipid Profile: No results for input(s): CHOL, HDL, LDLCALC, TRIG, CHOLHDL, LDLDIRECT in the last 72 hours. Thyroid Function Tests: No results for input(s): TSH, T4TOTAL, FREET4, T3FREE, THYROIDAB in the last 72 hours. Anemia Panel: No results for input(s): VITAMINB12, FOLATE, FERRITIN, TIBC, IRON, RETICCTPCT in the last 72 hours. Urine analysis:    Component Value Date/Time   COLORURINE YELLOW 05/05/2018 1803   APPEARANCEUR HAZY (A) 05/05/2018 1803   LABSPEC 1.018 05/05/2018 1803   PHURINE 5.0 05/05/2018 1803   GLUCOSEU NEGATIVE 05/05/2018 1803   HGBUR LARGE (A) 05/05/2018 1803   BILIRUBINUR NEGATIVE 05/05/2018 1803   KETONESUR 20 (A) 05/05/2018 1803   PROTEINUR NEGATIVE 05/05/2018 1803   NITRITE NEGATIVE 05/05/2018 1803   LEUKOCYTESUR TRACE (A) 05/05/2018 1803   Sepsis Labs: @LABRCNTIP (procalcitonin:4,lacticidven:4)  ) Recent Results (from the past 240 hour(s))  Urine culture     Status: Abnormal   Collection Time: 05/05/18  6:01 PM  Result Value Ref Range Status   Specimen Description   Final    URINE, CLEAN CATCH Performed at [facility], [address] [place], [place] [address]    Special Requests   Final    NONE Performed at [facility], [address], [place], [place] [address]    Culture MULTIPLE SPECIES PRESENT, SUGGEST RECOLLECTION (A)  Final   Report Status 05/07/2018 FINAL  Final  Culture, blood (routine x 2)     Status: None (Preliminary result)   Collection Time: 05/05/18  6:10 PM  Result Value Ref Range Status   Specimen Description   Final    BLOOD LEFT ANTECUBITAL Performed at [facility], [address], [place], [place] [address]    Special Requests   Final    BOTTLES DRAWN AEROBIC AND ANAEROBIC Blood Culture results may not be optimal due to an excessive volume of blood received in culture bottles Performed at [facility], [address] [place], [place] [address]    Culture   Final    NO GROWTH 3 DAYS Performed at [facility], [address], [place], [place] [address]    Report Status PENDING  Incomplete  Culture, blood (routine x 2)  Status: None (Preliminary result)   Collection Time: 05/05/18  6:11 PM  Result Value Ref Range Status   Specimen Description   Final    BLOOD RIGHT ANTECUBITAL Performed at [facility], [address], [place], [place] [address]    Special Requests   Final    BOTTLES DRAWN AEROBIC AND ANAEROBIC Blood Culture results may not be optimal due to an excessive volume of blood received in culture bottles Performed at [facility], [address] [place], [place] [address]    Culture   Final    NO GROWTH 3 DAYS Performed at [facility], [address], [place], [place] [address]    Report Status PENDING  Incomplete  CSF culture     Status: None   Collection Time: 05/05/18  8:29 PM  Result Value Ref Range Status   Specimen Description   Final    CSF Performed at [facility], [address], [place], [place] [address]    Special Requests   Final     NONE Performed at [facility], [address], [place], [place] [address]    Gram Stain   Final    WBC PRESENT,BOTH PMN AND MONONUCLEAR NO ORGANISMS SEEN CYTOSPIN SMEAR    Culture   Final    NO GROWTH 3 DAYS Performed at [facility], [address] [place], [place] [address]    Report Status 05/09/2018 FINAL  Final  C difficile quick scan w PCR reflex     Status: None   Collection Time: 05/06/18  8:24 PM  Result Value Ref Range Status   C Diff antigen NEGATIVE NEGATIVE Final   C Diff toxin NEGATIVE NEGATIVE Final   C Diff interpretation No C. difficile detected.  Final    Comment: Performed at [facility], [address] [place], [place] [address]      Studies: No results found.  Scheduled Meds: . hydrocortisone cream   Topical QID  . loratadine  10 mg Oral Daily    Continuous Infusions:   LOS: 2 days     Darlin Drop, MD [facility] Pager [phone]  If 7PM-7AM, please contact night-coverage [email or web address] Password [email or web address] 05/09/2018, 12:06 PM

## 2018-05-10 LAB — BASIC METABOLIC PANEL
Anion gap: 8 (ref 5–15)
BUN: 6 mg/dL (ref 6–20)
CALCIUM: 8.6 mg/dL — AB (ref 8.9–10.3)
CO2: 24 mmol/L (ref 22–32)
CREATININE: 0.6 mg/dL (ref 0.44–1.00)
Chloride: 104 mmol/L (ref 98–111)
GFR calc non Af Amer: >60 mL/min (ref >60)
GLUCOSE: 101 mg/dL — AB (ref 70–99)
Potassium: 4 mmol/L (ref 3.5–5.1)
Sodium: 136 mmol/L (ref 135–145)

## 2018-05-10 LAB — CULTURE, BLOOD (ROUTINE X 2)
CULTURE: NO GROWTH
CULTURE: NO GROWTH

## 2018-05-10 LAB — IMMUNOGLOBULINS A/E/G/M, SERUM
IGG (IMMUNOGLOBIN G), SERUM: 679 mg/dL — AB (ref 700–1600)
IgA: 142 mg/dL (ref 87–352)
IgE (Immunoglobulin E), Serum: 16 IU/mL (ref 6–495)
IgM (Immunoglobulin M), Srm: 92 mg/dL (ref 26–217)

## 2018-05-10 LAB — PARVOVIRUS B19 ANTIBODY, IGG AND IGM
PAROVIRUS B19 IGG ABS: 0.5 {index} (ref 0.0–0.8)
Parovirus B19 IgM Abs: 0.2 index (ref 0.0–0.8)

## 2018-05-10 LAB — MAGNESIUM: Magnesium: 2.2 mg/dL (ref 1.7–2.4)

## 2018-05-10 LAB — KAPPA/LAMBDA LIGHT CHAINS
KAPPA, LAMDA LIGHT CHAIN RATIO: 0.98 (ref 0.26–1.65)
Kappa free light chain: 7.9 mg/L (ref 3.3–19.4)
LAMDA FREE LIGHT CHAINS: 8.1 mg/L (ref 5.7–26.3)

## 2018-05-10 MED ORDER — SUMATRIPTAN SUCCINATE 25 MG PO TABS
25.0000 mg | ORAL_TABLET | Freq: Two times a day (BID) | ORAL | Status: DC | PRN
  Filled 2018-05-10: qty 1

## 2018-05-10 MED ORDER — ONDANSETRON 4 MG PO TBDP: 4.0000 mg | ORAL_TABLET | Freq: Three times a day (TID) | ORAL | 0 refills | Status: DC | PRN

## 2018-05-10 MED ORDER — SUMATRIPTAN SUCCINATE 25 MG PO TABS: 25.0000 mg | ORAL_TABLET | Freq: Two times a day (BID) | ORAL | 0 refills | Status: AC | PRN

## 2018-05-10 MED ORDER — ONDANSETRON 4 MG PO TBDP: 4.0000 mg | ORAL_TABLET | Freq: Three times a day (TID) | ORAL | Status: DC | PRN

## 2018-05-10 MED ORDER — SUMATRIPTAN SUCCINATE 25 MG PO TABS: 25.0000 mg | ORAL_TABLET | Freq: Two times a day (BID) | ORAL | 0 refills | Status: DC | PRN

## 2018-05-10 NOTE — Discharge Summary (Addendum)
Discharge Summary  Melinda Gray OZH:[id number] DOB: [birth year]  PCP: Patient, No Pcp Per  Admit date: 05/05/2018 Discharge date: 05/10/2018  Time spent: 35 minutes  Recommendations for Outpatient Follow-up:  1. Follow-up with your PCP  2. Please have your PCP refer you to an immunologist 3. Take your medications as prescribed  Discharge Diagnoses:  Active Hospital Problems   Diagnosis Date Noted  . Aseptic meningitis 05/07/2018  . Primary immune deficiency disorder (HCC) 05/06/2018  . SIRS (systemic inflammatory response syndrome) (HCC) 05/05/2018    Resolved Hospital Problems  No resolved problems to display.    Discharge Condition: Stable  Diet recommendation: Resume previous diet  Vitals:   05/10/18 0020 05/10/18 0615  BP: 97/66 107/64  Pulse: 68 94  Resp: 14 16  Temp: 98.8 F (37.1 C) 98.7 F (37.1 C)  SpO2: 100% 99%    History of present illness:  Patient is a 22 year old female with past medical history of primary immunodeficiency disorder who was taken subcutaneous IG injection every other weeks prior to moving from another state who presented to the emergency department with complaints of persistent headache, fever and chills along with generalized body ache. Also reported of photophobia, sore throat and neck pain. Since she was immunocompromised, there was concern for meningitis versus encephalitis and she was started on broad-spectrum antibiotics. IDand hematology followed.  Post lumbar puncture.  CSF cultures showed no growth to date.  IV antibiotics DC'd.  Completed 3 days of IVIG infusions.  Hospital course complicated by hypotension around time of IVIG infusion which has now resolved.  05/10/2018: Patient seen and examined with her mother at bedside.  Reports migraine headache and nausea which are improving post Tylenol and Zofran.  Vital signs are stable.  Symptoms are resolving.  Feels a little anxious but does not want any medications for it,  states has a history of anxiety and uses coping mechanism to resolve it.  On the day of discharge, the patient was hemodynamically stable.  She will need to follow-up with her primary care provider, and have her PCP give her referral to an immunologist.    Hospital Course:  Principal Problem:   Aseptic meningitis Active Problems:   SIRS (systemic inflammatory response syndrome) (HCC)   Primary immune deficiency disorder (HCC)  Aseptic/viral meningitis CSF cultures no growth to date Antibiotics DC'd by ID No need for further antibiotics  Primary immunodeficiency disorder Was taking IVIG every other week lost to follow-up after moving from another state Has received 2/3 IVIG infusions Third IVIG infusion plan today Advised to follow-up with an immunologist posthospitalization which will be referred by her PCP Does not have a PCP however mother is wanting referral to [place] at [place], Dr. Betty Swaziland.  Resolved hypotension Possibly secondary to IVIG infusion  Resolved hypokalemia post repletion  Migraine headache Sumatriptan as needed Follow-up with your primary care provider  Chronic anxiety Patient refuses to take medications for it Follow-up with your PCP  Procedures:  Lumbar puncture  Consultations:  Infectious disease  Oncology  Discharge Exam: BP 107/64   Pulse 94   Temp 98.7 F (37.1 C) (Oral)   Resp 16   Ht 5\' 5"  (1.651 m)   Wt 61.2 kg   LMP 04/05/2018   SpO2 99%   BMI 22.47 kg/m  . General: 22 y.o. year-old female well developed well nourished in no acute distress.  Alert and oriented x3. . Cardiovascular: Regular rate and rhythm with no rubs or gallops.  No thyromegaly or JVD  noted.   . Respiratory: Clear to auscultation with no wheezes or rales. Good inspiratory effort. . Abdomen: Soft nontender nondistended with normal bowel sounds x4 quadrants. . Musculoskeletal: No lower extremity edema. 2/4 pulses in all 4 extremities. . Skin: No  ulcerative lesions noted or rashes, . Psychiatry: Mood is appropriate for condition and setting  Discharge Instructions You were cared for by a hospitalist during your hospital stay. If you have any questions about your discharge medications or the care you received while you were in the hospital after you are discharged, you can call the unit and asked to speak with the hospitalist on call if the hospitalist that took care of you is not available. Once you are discharged, your primary care physician will handle any further medical issues. Please note that NO REFILLS for any discharge medications will be authorized once you are discharged, as it is imperative that you return to your primary care physician (or establish a relationship with a primary care physician if you do not have one) for your aftercare needs so that they can reassess your need for medications and monitor your lab values.   Allergies as of 05/10/2018      Reactions   Amoxicillin    Augmentin [amoxicillin-pot Clavulanate]       Medication List    STOP taking these medications   PAMPRIN ALL DAY RELIEF MAX ST 220 MG tablet Generic drug:  naproxen sodium     TAKE these medications   NUVARING 0.12-0.015 MG/24HR vaginal ring Generic drug:  etonogestrel-ethinyl estradiol Place 1 each vaginally every 28 (twenty-eight) days.   ondansetron 4 MG disintegrating tablet Commonly known as:  ZOFRAN-ODT Take 1 tablet (4 mg total) by mouth every 8 (eight) hours as needed for nausea or vomiting.   SUMAtriptan 25 MG tablet Commonly known as:  IMITREX Take 1 tablet (25 mg total) by mouth every 12 (twelve) hours as needed for migraine or headache. May repeat in 2 hours if headache persists or recurs.      Allergies  Allergen Reactions  . Amoxicillin   . Augmentin [Amoxicillin-Pot Clavulanate]    Follow-up Information    Swaziland, Betty G, MD. Call in 1 day(s).   Specialty:  Family Medicine Why:  Please call for an appointment;  please obtain referral to immunologist from your PCP. Contact information: [address] [place] [address] [phone]            The results of significant diagnostics from this hospitalization (including imaging, microbiology, ancillary and laboratory) are listed below for reference.    Significant Diagnostic Studies: No results found.  Microbiology: Recent Results (from the past 240 hour(s))  Urine culture     Status: Abnormal   Collection Time: 05/05/18  6:01 PM  Result Value Ref Range Status   Specimen Description   Final    URINE, CLEAN CATCH Performed at [facility], [address] [place], [place] [address]    Special Requests   Final    NONE Performed at [facility], [address] [place], [place] [address]    Culture MULTIPLE SPECIES PRESENT, SUGGEST RECOLLECTION (A)  Final   Report Status 05/07/2018 FINAL  Final  Culture, blood (routine x 2)     Status: None (Preliminary result)   Collection Time: 05/05/18  6:10 PM  Result Value Ref Range Status   Specimen Description   Final    BLOOD LEFT ANTECUBITAL Performed at [facility], [address], [place], [place] [address]    Special Requests   Final    BOTTLES DRAWN AEROBIC AND ANAEROBIC Blood Culture results may not be optimal due to an excessive volume of blood received in culture bottles Performed at [facility], [address], [place], [place] [address]    Culture   Final    NO GROWTH 4 DAYS Performed at [facility], [address] [place], [place] [address]    Report Status PENDING  Incomplete  Culture, blood (routine x 2)     Status: None (Preliminary result)   Collection Time: 05/05/18  6:11 PM  Result Value Ref Range Status   Specimen Description   Final    BLOOD RIGHT ANTECUBITAL Performed at [facility], [address] [place], [place] [address]    Special Requests    Final    BOTTLES DRAWN AEROBIC AND ANAEROBIC Blood Culture results may not be optimal due to an excessive volume of blood received in culture bottles Performed at [facility], [address] [place], [place] [address]    Culture   Final    NO GROWTH 4 DAYS Performed at [facility], [address], [place], [place] [address]    Report Status PENDING  Incomplete  CSF culture     Status: None   Collection Time: 05/05/18  8:29 PM  Result Value Ref Range Status   Specimen Description   Final    CSF Performed at [facility], [address], [place], [place] [address]    Special Requests   Final    NONE Performed at [facility], [address] [place], [place] [address]    Gram Stain   Final    WBC PRESENT,BOTH PMN AND MONONUCLEAR NO ORGANISMS SEEN CYTOSPIN SMEAR    Culture   Final    NO GROWTH 3 DAYS Performed at [facility], [address] [place], [place] [address]    Report Status 05/09/2018 FINAL  Final  C difficile quick scan w PCR reflex     Status: None   Collection Time: 05/06/18  8:24 PM  Result Value Ref Range Status   C Diff antigen NEGATIVE NEGATIVE Final   C Diff toxin NEGATIVE NEGATIVE Final   C Diff interpretation No C. difficile detected.  Final    Comment: Performed at [facility], [address] [place], [place] [address]     Labs: Basic Metabolic Panel: Recent Labs  Lab 05/05/18 1803 05/06/18 0556 05/07/18 0601 05/09/18 0559 05/10/18 0613  NA 137 140 140 139 136  K 3.6 3.2* 3.5 3.3* 4.0  CL 103 113* 113* 104 104  CO2 24 21* 20* 26 24  GLUCOSE 92 100* 96 90 101*  BUN 7 6 <5* 5* 6  CREATININE 0.67 0.75 0.65 0.55 0.60  CALCIUM 9.0 7.7* 8.0* 8.6* 8.6*  MG  --   --   --   --  2.2   Liver Function Tests: Recent Labs  Lab 05/05/18 1803 05/07/18 1546  AST 14* 12*  ALT 12 12  ALKPHOS 46 41  BILITOT 0.7 0.3  PROT 7.8 6.7  ALBUMIN 3.9 3.4*   No results for  input(s): LIPASE, AMYLASE in the last 168 hours. No results for input(s): AMMONIA in the last 168 hours. CBC: Recent Labs  Lab 05/05/18 1803 05/06/18 0556 05/07/18 0601 05/09/18 0559  WBC 20.7* 12.1* 7.0 4.0  NEUTROABS  18.8* 9.8* 4.4 2.1  HGB 12.7 9.7* 9.4* 11.2*  HCT 38.9 29.9* 29.7* 34.4*  MCV 85.7 86.2 87.1 85.1  PLT 266 213 211 289   Cardiac Enzymes: No results for input(s): CKTOTAL, CKMB, CKMBINDEX, TROPONINI in the last 168 hours. BNP: BNP (last 3 results) No results for input(s): BNP in the last 8760 hours.  ProBNP (last 3 results) No results for input(s): PROBNP in the last 8760 hours.  CBG: No results for input(s): GLUCAP in the last 168 hours.     Signed:  Darlin Drop, MD [facility] 05/10/2018, 9:49 AM

## 2018-05-10 NOTE — Progress Notes (Signed)
PT Cancellation Note  Patient Details Name: Genette Huertas MRN: [id number] DOB: 1996/02/15   Cancelled Treatment:    Reason Eval/Treat Not Completed: PT screened, no needs identified, will sign off   Rada Hay 05/10/2018, 10:09 AM  Blanchard Kelch PT Acute Rehabilitation Services Pager [phone] Office [phone]

## 2018-05-11 LAB — MISC LABCORP TEST (SEND OUT): LABCORP TEST CODE: 1495

## 2018-05-18 ENCOUNTER — Ambulatory Visit: Payer: 59 | Admitting: Family Medicine

## 2018-05-18 ENCOUNTER — Encounter: Payer: Self-pay | Admitting: Family Medicine

## 2018-05-18 ENCOUNTER — Encounter: Payer: Self-pay | Admitting: Neurology

## 2018-05-18 VITALS — BP 100/60 | HR 73 | Temp 98.3°F | Resp 12 | Ht 65.0 in | Wt 135.2 lb

## 2018-05-18 DIAGNOSIS — K219 Gastro-esophageal reflux disease without esophagitis: Secondary | ICD-10-CM

## 2018-05-18 DIAGNOSIS — D849 Immunodeficiency, unspecified: Secondary | ICD-10-CM

## 2018-05-18 DIAGNOSIS — J309 Allergic rhinitis, unspecified: Secondary | ICD-10-CM | POA: Diagnosis not present

## 2018-05-18 DIAGNOSIS — D8489 Other immunodeficiencies: Secondary | ICD-10-CM

## 2018-05-18 DIAGNOSIS — B373 Candidiasis of vulva and vagina: Secondary | ICD-10-CM

## 2018-05-18 DIAGNOSIS — G030 Nonpyogenic meningitis: Secondary | ICD-10-CM

## 2018-05-18 DIAGNOSIS — B3731 Acute candidiasis of vulva and vagina: Secondary | ICD-10-CM

## 2018-05-18 DIAGNOSIS — G43009 Migraine without aura, not intractable, without status migrainosus: Secondary | ICD-10-CM

## 2018-05-18 MED ORDER — FLUTICASONE PROPIONATE 50 MCG/ACT NA SUSP: 2.0000 | Freq: Every day | NASAL | 2 refills | Status: AC | PRN

## 2018-05-18 MED ORDER — TERCONAZOLE 0.4 % VA CREA: 1.0000 | TOPICAL_CREAM | Freq: Every day | VAGINAL | 1 refills | Status: DC

## 2018-05-18 MED ORDER — NYSTATIN-TRIAMCINOLONE 100000-0.1 UNIT/GM-% EX CREA: 1.0000 "application " | TOPICAL_CREAM | Freq: Two times a day (BID) | CUTANEOUS | 0 refills | Status: DC | PRN

## 2018-05-18 NOTE — Assessment & Plan Note (Signed)
For now she will continue Imitrex as needed. Neurology referral placed as requested Instructed about warning signs.

## 2018-05-18 NOTE — Patient Instructions (Addendum)
A few things to remember from today's visit:   Primary immune deficiency disorder (HCC) - Plan: Ambulatory referral to Immunology  Migraine without aura and without status migrainosus, not intractable - Plan: Ambulatory referral to Neurology  Candidal vulvovaginitis - Plan: terconazole (TERAZOL 7) 0.4 % vaginal cream, nystatin-triamcinolone (MYCOLOG II) cream   Please be sure medication list is accurate. If a new problem present, please set up appointment sooner than planned today.

## 2018-05-18 NOTE — Assessment & Plan Note (Signed)
Currently asymptomatic. Continue Nexium 20 mg daily open symptoms onset for a couple weeks. We discussed some side effects of PPIs. GERD precautions discussed.

## 2018-05-18 NOTE — Assessment & Plan Note (Signed)
Symptoms well controlled with Flonase intranasal spray. No changes in current management. Follow-up as needed

## 2018-05-18 NOTE — Assessment & Plan Note (Signed)
Still having some residual symptoms, headache, but progressively improving. I do not think further work-up is needed today. She was clearly instructed about warning signs.

## 2018-05-18 NOTE — Progress Notes (Signed)
HPI:   Melinda Gray is a 22 y.o. female, who is here today to establish care.  Former PCP: N/A Last preventive routine visit: 11/2017, she had her preventive gynecologic care.  Chronic medical problems: Migraine, allergies, asthma, GERD,depression, anxiety, primary immune deficiency disorder among some.  She was following with immunologist, taking subcutaneous IG injections every other week prior to moving to the area. Primary immune deficiency disorder diagnosed at age 71. She is reporting vaccines up-to-date.  Allergic rhinitis: She is currently on Flonase nasal spray. Intermittent nasal congestion and rhinorrhea.  Migraine headaches: Usually retro-ocular and temporal, throbbing, 6-8/10. She has episodes of migraine 2-3 times per month. She takes Imitrex. Associated nausea and photophobia.  Anxiety and depression diagnosed at age 9.  She took medication for a while and did psychotherapy. She reports side effects from antidepressant medications, they were causing panic attacks. She denies suicidal thoughts.  Currently she is living with her mother.  GERD she takes OTC Nexium for 2 weeks at the time when she has heartburn. Spicy, salty foods, and dailies seems to aggravate symptoms. Frequent burping. No abdominal pain, changes in bowel habits, or blood in the stool.  Concerns today: She needs referral for neurologist on immunologist. She moved to the area from [place] in 03/2018.   She was hospitalized recently, admitted on 05/05/2017 and discharged on 05/10/2017. She presented to the ER on 05/05/2018 complaining of fever, chills, body aches, and persistent headache associated with photophobia and neck pain. Broad spectrum antibiotic started and continued until CSF final culture with no growth.  ID and hematology consultation during hospitalization. She completed 3 days of IV Ig infusions.  Lab Results  Component Value Date   CREATININE 0.60 05/10/2018   BUN 6 05/10/2018   NA 136 05/10/2018   K 4.0 05/10/2018   CL 104 05/10/2018   CO2 24 05/10/2018   Lab Results  Component Value Date   WBC 4.0 05/09/2018   HGB 11.2 (L) 05/09/2018   HCT 34.4 (L) 05/09/2018   MCV 85.1 05/09/2018   PLT 289 05/09/2018    Lab Results  Component Value Date   ALT 12 05/07/2018   AST 12 (L) 05/07/2018   ALKPHOS 41 05/07/2018   BILITOT 0.3 05/07/2018    Still having occipital headaches related to frontal area + neck pain. Symptoms are progressively getting better. Occipital headache is almost daily, 8-9/10. She denies fever, chills, tingling, numbness, or focal deficit. She still having mild sore throat and intermittent nausea.  She has not identified exacerbating or alleviating factors.  She is also complaining about vaginal and perineal pruritus, erythematous rash since hospital discharge. She attributes problem to recent antibiotic treatment. She denies sex intercourse and reporting recent STD screening as negative. She has had similar problem in the past, Diflucan usually does not help. She denies vaginal discharge or bleeding. She has not used OTC medications.  Review of Systems  Constitutional: Positive for fatigue. Negative for activity change, appetite change and fever.  HENT: Negative for mouth sores, nosebleeds, trouble swallowing and voice change.   Eyes: Negative for redness and visual disturbance.  Respiratory: Negative for cough, shortness of breath and wheezing.   Cardiovascular: Negative for chest pain, palpitations and leg swelling.  Gastrointestinal: Positive for nausea. Negative for abdominal pain and vomiting.       Negative for changes in bowel habits.  Genitourinary: Negative for decreased urine volume, dysuria, hematuria, vaginal bleeding and vaginal discharge.  Musculoskeletal: Positive for neck pain.  Negative for gait problem and myalgias.  Skin: Negative for rash and wound.  Allergic/Immunologic: Positive for  environmental allergies.  Neurological: Positive for headaches. Negative for seizures, syncope, weakness and numbness.  Psychiatric/Behavioral: Negative for confusion. The patient is nervous/anxious.       Current Outpatient Medications on File Prior to Visit  Medication Sig Dispense Refill  . albuterol (PROVENTIL HFA;VENTOLIN HFA) 108 (90 Base) MCG/ACT inhaler Inhale into the lungs.    . Immune Globulin, Human, (HIZENTRA Hopkins) Inject 16 mg into the skin.    Marland Kitchen NUVARING 0.12-0.015 MG/24HR vaginal ring Place 1 each vaginally every 28 (twenty-eight) days.   11  . ondansetron (ZOFRAN-ODT) 4 MG disintegrating tablet Take 1 tablet (4 mg total) by mouth every 8 (eight) hours as needed for nausea or vomiting. 20 tablet 0  . SUMAtriptan (IMITREX) 25 MG tablet Take 1 tablet (25 mg total) by mouth every 12 (twelve) hours as needed for migraine or headache. May repeat in 2 hours if headache persists or recurs. 10 tablet 0   No current facility-administered medications on file prior to visit.      Past Medical History:  Diagnosis Date  . Allergy   . Asthma   . Chronic bronchitis (HCC)   . Depression   . Frequent headaches   . GERD (gastroesophageal reflux disease)   . Primary immune deficiency disorder (HCC)    Allergies  Allergen Reactions  . Amoxicillin Nausea And Vomiting and Rash  . Amoxicillin-Pot Clavulanate Nausea And Vomiting and Rash    Other reaction(s): Dizziness    Family History  Problem Relation Age of Onset  . Alport syndrome Father   . Hearing loss Father   . Hyperlipidemia Father   . Hypertension Father   . Kidney disease Father   . Diabetes Mellitus II Maternal Grandmother   . Arthritis Maternal Grandmother   . COPD Maternal Grandmother   . Depression Maternal Grandmother   . Cancer Maternal Grandmother   . Hearing loss Maternal Grandmother   . Miscarriages / [place] Mother   . Depression Maternal Grandfather   . COPD Maternal Grandfather   . Arthritis  Paternal Grandmother   . Hyperlipidemia Paternal Grandmother   . Hypertension Paternal Grandmother   . Stroke Paternal Grandmother     Social History   Socioeconomic History  . Marital status: Single    Spouse name: Not on file  . Number of children: Not on file  . Years of education: Not on file  . Highest education level: Not on file  Occupational History  . Not on file  Social Needs  . Financial resource strain: Not hard at all  . Food insecurity:    Worry: Never true    Inability: Never true  . Transportation needs:    Medical: No    Non-medical: No  Tobacco Use  . Smoking status: Never Smoker  . Smokeless tobacco: Never Used  Substance and Sexual Activity  . Alcohol use: Yes    Frequency: Never    Comment: occasional  . Drug use: Never  . Sexual activity: Yes    Partners: Male    Birth control/protection: Inserts  Lifestyle  . Physical activity:    Days per week: Not on file    Minutes per session: Not on file  . Stress: Only a little  Relationships  . Social connections:    Talks on phone: More than three times a week    Gets together: More than three times a week  Attends religious service: Not on file    Active member of club or organization: Yes    Attends meetings of clubs or organizations: 1 to 4 times per year    Relationship status: Never married  Other Topics Concern  . Not on file  Social History Narrative  . Not on file    Vitals:   05/18/18 1201  BP: 100/60  Pulse: 73  Resp: 12  Temp: 98.3 F (36.8 C)  SpO2: 97%    Body mass index is 22.51 kg/m.   Physical Exam  Nursing note and vitals reviewed. Constitutional: She is oriented to person, place, and time. She appears well-developed and well-nourished. No distress.  HENT:  Head: Normocephalic and atraumatic.  Mouth/Throat: Oropharynx is clear and moist and mucous membranes are normal.  Eyes: Pupils are equal, round, and reactive to light. Conjunctivae are normal.    Cardiovascular: Normal rate and regular rhythm.  No murmur heard. Pulses:      Dorsalis pedis pulses are 2+ on the right side, and 2+ on the left side.  Respiratory: Effort normal and breath sounds normal. No respiratory distress.  GI: Soft. She exhibits no mass. There is no hepatomegaly. There is no tenderness.  Genitourinary:  Genitourinary Comments: Deferred to gynecologist.  Musculoskeletal: She exhibits no edema.       Cervical back: She exhibits no tenderness and no bony tenderness.  Lymphadenopathy:    She has no cervical adenopathy.  Neurological: She is alert and oriented to person, place, and time. She has normal strength. No cranial nerve deficit. Gait normal.  Skin: Skin is warm. No rash noted. No erythema.  Psychiatric: Her mood appears anxious.  Well groomed, good eye contact.      ASSESSMENT AND PLAN:   Gwendolyne was seen today for establish care.  Diagnoses and all orders for this visit:  Primary immune deficiency disorder (HCC) She completed IVIG infusions during hospitalization. She will continue following with immunologist. Instructed about warning signs.  Aseptic meningitis Still having some residual symptoms, headache, but progressively improving. I do not think further work-up is needed today. She was clearly instructed about warning signs.   Migraine headache without aura For now she will continue Imitrex as needed. Neurology referral placed as requested Instructed about warning signs.  Allergic rhinitis Symptoms well controlled with Flonase intranasal spray. No changes in current management. Follow-up as needed  GERD (gastroesophageal reflux disease) Currently asymptomatic. Continue Nexium 20 mg daily open symptoms onset for a couple weeks. We discussed some side effects of PPIs. GERD precautions discussed.  Candidal vulvovaginitis Empiric treatment with Terazol recommended. Mycolog-II cream to apply on external area, twice daily as  needed. She is planning on establishing with gynecologist, follow-up as needed.  -     terconazole (TERAZOL 7) 0.4 % vaginal cream; Place 1 applicator vaginally at bedtime. -     nystatin-triamcinolone (MYCOLOG II) cream; Apply 1 application topically 2 (two) times daily as needed. External use.     Betty G. [place], MD  [facility]. [place] office.

## 2018-05-18 NOTE — Assessment & Plan Note (Signed)
She completed IVIG infusions during hospitalization. She will continue following with immunologist. Instructed about warning signs.

## 2018-05-20 ENCOUNTER — Telehealth: Payer: Self-pay | Admitting: Family Medicine

## 2018-05-20 ENCOUNTER — Other Ambulatory Visit: Payer: Self-pay | Admitting: *Deleted

## 2018-05-20 MED ORDER — NYSTATIN 100000 UNIT/GM EX CREA: 1.0000 "application " | TOPICAL_CREAM | Freq: Two times a day (BID) | CUTANEOUS | 0 refills | Status: DC

## 2018-05-20 NOTE — Telephone Encounter (Signed)
Rx sent to pharmacy for Nystatin external cream BID.

## 2018-05-20 NOTE — Telephone Encounter (Signed)
Can use nystatin external cream BID instead of the combined cream.  Ok to send to pharmacy, 0 refills.

## 2018-05-20 NOTE — Telephone Encounter (Signed)
Copied from CRM [id number]. Topic: General - Other >> May 20, 2018  2:24 PM Tamela Oddi wrote: Reason for CRM: Herbert Seta with [facility] called to inform the doctor that the patient's medication for nystatin-triamcinolone (MYCOLOG II) cream is not covered under the insurance unless it is filled in a split dosage.  Please advise.  CB# [phone]

## 2018-05-20 NOTE — Telephone Encounter (Signed)
Please advise. PCP out out of office until Monday. Thanks

## 2018-05-25 ENCOUNTER — Ambulatory Visit: Payer: 59 | Admitting: Allergy & Immunology

## 2018-05-25 ENCOUNTER — Encounter: Payer: Self-pay | Admitting: Allergy & Immunology

## 2018-05-25 VITALS — BP 108/72 | HR 74 | Temp 97.5°F | Ht 64.3 in | Wt 138.0 lb

## 2018-05-25 DIAGNOSIS — J4530 Mild persistent asthma, uncomplicated: Secondary | ICD-10-CM | POA: Diagnosis not present

## 2018-05-25 DIAGNOSIS — J310 Chronic rhinitis: Secondary | ICD-10-CM

## 2018-05-25 DIAGNOSIS — D803 Selective deficiency of immunoglobulin G [IgG] subclasses: Secondary | ICD-10-CM

## 2018-05-25 DIAGNOSIS — J0100 Acute maxillary sinusitis, unspecified: Secondary | ICD-10-CM

## 2018-05-25 MED ORDER — DOXYCYCLINE HYCLATE 100 MG PO TABS: 100.0000 mg | ORAL_TABLET | Freq: Two times a day (BID) | ORAL | 0 refills | Status: AC

## 2018-05-25 MED ORDER — ALBUTEROL SULFATE HFA 108 (90 BASE) MCG/ACT IN AERS: 2.0000 | INHALATION_SPRAY | RESPIRATORY_TRACT | 2 refills | Status: DC | PRN

## 2018-05-25 NOTE — Progress Notes (Signed)
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 presents to establish care.  She reportedly has a history of IgG subclass deficiency.  Unfortunately, we have none of her outside records.  We will need those outside records to verify her diagnosis and get her immunoglobulin replacement approved.  She currently spends around 4 hours every 2 weeks with her infusions, and she is interested in decreasing this.  In that light, we will change her over to Cuvitru every 2 weeks.  We will get her outside records before determining a dose.  Unfortunately, if we obtained any kind of vaccine titers at this time it would be more reflective of the IVIG she received in the hospital rather than her baseline status.  Therefore, we did not obtain any kind of lab work today.  Once I receive the outside records, I will work with our Biologics coordinator to get this immunoglobulin replacement approved.   Plan/Recommendations:   1. IgG subclass deficiency - We will get your outside records and work on getting the immunoglobulin replacement approved. - It will probably take a couple of weeks to get this approved but we will work hard on this.  - We will likely change to Cuvitru because it can be infused a little quicker.   2. Chronic rhinitis - Hold off on the Flonase for one week to allow your nose to heal.  - Add on nasal saline rinses. - Try using Ayr nasal saline gel to keep your nasal passages moist.  - Continue with Xyzal daily. - We will get labs to check on your environmental allergies.   3. Acute non-recurrent maxillary sinusitis - With your current symptoms and time course, antibiotics might be needed: doxycycline 100mg  twice daily for 14 days    - If you think that you need it even with the nasal saline rinses, do ahead and start the antibiotic.   4. Mild persistent asthma, uncomplicated - Lung testing looks good today. - We will refill your albuterol today. - Use albuterol 2-4 puffs every 4-6 hours as needed.  5. Return in about 4 weeks (around 06/22/2018).   Subjective:   Melinda Gray is a 22 y.o. female presenting today for evaluation of  Chief Complaint  Patient presents with  . Follow-up    Hospitalization  . Establish Care    Immunologist- Melinda Gray Primary immune disorder of the IgG     Melinda Gray has a history of the following: Patient Active Problem List   Diagnosis Date Noted  . Migraine headache without aura 05/18/2018  . Allergic rhinitis 05/18/2018  . GERD (gastroesophageal reflux disease) 05/18/2018  . Aseptic meningitis 05/07/2018  . Primary immune deficiency disorder (HCC) 05/06/2018  . SIRS (systemic inflammatory response syndrome) (HCC) 05/05/2018    History obtained from: chart review and patient.  Melinda Gray was referred by [place], Betty G, MD.     Melinda Gray is a 22 y.o. female presenting to establish care. She has a history of an IgG subclass deficiency, although we do not have the records from her outside immunologist. She was diagnosed when she was 22 years old after sufferings for nearly 7 years with recurrent infections. Infections consisted of sinopulmonary infections for the most part.  She remembers in  eighth grade being sick for 9 days with fevers up to 102-106, although she likely had infections prior to this.  She did not need any IV antibiotics or hospital stays during this initial seven years.  She tells me she was on antibiotics daily during her freshman year of high school.  She was finally diagnosed by Dr. Lottie Dawson at [facility] with IgG subclass deficiency [phone]). She was placed on  immunoglobulin replacement therapy every other week at the age of 4 and since then had done fairly well.   She is on 16 gm Hizentra every two weeks. She uses four sites on her legs and it takes 2-4 hours. She gets slow infusiuons due to headaches. She works on homework during  the infusion. Her last Hizentra dose was in June 2019; she has not established care with an immunologist because other things seemed to get in the way.    However, she did receive some immuglobin infusions when she was hospitalized recently from 10/24 through 10/28. At that time, she was diagnosed with aspectic meningitis.  She initially presented with several days of headache, chills, and body aches.  She had been back in [place] for a bachelorette party when the symptoms started.  She underwent a lumbar puncture in the ER that showed multiple red blood cells, protein of 21, glucose of 54, and a white blood count of 10.  Her culture was no growth to date.  She had multiple blood cultures during her hospitalization which were all no growth to date.  Infectious disease was consulted (Dr. Ninetta Lights).  They recommended consideration of histoplasmosis since she was visiting [place].  He also felt that viral meningitis was a possibility as well, but there was not enough CSF fluid to do those studies.  He recommended changing Bactrim to meropenem and changing the IV acyclovir to Valtrex.  Crypto antigen was negative.  HSV PCR was negative.  She was discharged on Levaquin for 1 week.  Hematology was consulted to help dose IVIG due to her history of primary immune deficiency.  She was given 400 mg/kg IVIG daily for 2 days.  However, it seems that she received 3 infusions prior to discharge. Her last immunoglobulin panel was performed prior to her IVIG infusions in the hospital.  Her IgG was 679, IgA 142, IgM 92, and IgE 16.   Asthma/Respiratory Symptom History: Asthma is controlled. She does not wheeze but she typically just has stabbing pains  in the lower part of her lungs. She has been coughing having SOB since a hospitalization, but this has not changed with the use of her albuterol.  She has never been on a daily controller medication.  ACT score today is 19, indicating excellent asthma control.  Allergic Rhinitis Symptom History: She does have a history of allergic rhinitis.  She reports that her allergies worsen when she moved to [place]. She had testing one year ago when she was still living in [place]. She last had an antihistamine two days ago. She is using Flonase nasal spray. She is also having nose bleeds.  She was never on allergy shots.  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  She also has a history of migraines. Otherwise, there is no history of other atopic diseases, including drug allergies, stinging insect allergies, eczema or urticaria. There is no significant infectious history. Vaccinations are up to date.    Past  Medical History: Patient Active Problem List   Diagnosis Date Noted  . Migraine headache without aura 05/18/2018  . Allergic rhinitis 05/18/2018  . GERD (gastroesophageal reflux disease) 05/18/2018  . Aseptic meningitis 05/07/2018  . Primary immune deficiency disorder (HCC) 05/06/2018  . SIRS (systemic inflammatory response syndrome) (HCC) 05/05/2018    Medication List:  Allergies as of 05/25/2018      Reactions   Amoxicillin Nausea And Vomiting, Rash   Amoxicillin-pot Clavulanate Nausea And Vomiting, Rash   Other reaction(s): Dizziness      Medication List        Accurate as of 05/25/18  9:43 PM. Always use your most recent med list.          albuterol 108 (90 Base) MCG/ACT inhaler Commonly known as:  PROVENTIL HFA;VENTOLIN HFA Inhale 2 puffs into the lungs every 4 (four) hours as needed for wheezing or shortness of breath.   doxycycline 100 MG tablet Commonly known as:  VIBRA-TABS Take 1 tablet (100 mg total) by mouth 2 (two) times daily for 14 days.    fluticasone 50 MCG/ACT nasal spray Commonly known as:  FLONASE Place 2 sprays into both nostrils daily as needed for allergies or rhinitis.   HIZENTRA Wilkerson Inject 16 mg into the skin.   NUVARING 0.12-0.015 MG/24HR vaginal ring Generic drug:  etonogestrel-ethinyl estradiol Place 1 each vaginally every 28 (twenty-eight) days.   nystatin cream Commonly known as:  MYCOSTATIN Apply 1 application topically 2 (two) times daily.   nystatin-triamcinolone cream Commonly known as:  MYCOLOG II Apply 1 application topically 2 (two) times daily as needed. External use.   ondansetron 4 MG disintegrating tablet Commonly known as:  ZOFRAN-ODT Take 1 tablet (4 mg total) by mouth every 8 (eight) hours as needed for nausea or vomiting.   SUMAtriptan 25 MG tablet Commonly known as:  IMITREX Take 1 tablet (25 mg total) by mouth every 12 (twelve) hours as needed for migraine or headache. May repeat in 2 hours if headache persists or recurs.       Birth History: non-contributory  Developmental History: non-contributory.   Past Surgical History: Past Surgical History:  Procedure Laterality Date  . FOOT SURGERY    . HIP SURGERY       Family History: Family History  Problem Relation Age of Onset  . Alport syndrome Father   . Hearing loss Father   . Hyperlipidemia Father   . Hypertension Father   . Kidney disease Father   . Allergic rhinitis Father   . Urticaria Father   . Diabetes Mellitus II Maternal Grandmother   . Arthritis Maternal Grandmother   . COPD Maternal Grandmother   . Depression Maternal Grandmother   . Cancer Maternal Grandmother   . Hearing loss Maternal Grandmother   . Allergic rhinitis Maternal Grandmother   . Urticaria Maternal Grandmother   . Miscarriages / [place] Mother   . Allergic rhinitis Mother   . Urticaria Mother   . Depression Maternal Grandfather   . COPD Maternal Grandfather   . Allergic rhinitis Maternal Grandfather   . Urticaria Maternal  Grandfather   . Arthritis Paternal Grandmother   . Hyperlipidemia Paternal Grandmother   . Hypertension Paternal Grandmother   . Stroke Paternal Grandmother   . Allergic rhinitis Paternal Grandmother   . Urticaria Paternal Grandmother   . Allergic rhinitis Paternal Grandfather   . Urticaria Paternal Grandfather   . Eczema Neg Hx   . Asthma Neg Hx       Social  History: Melinda Gray lives at home with her mother.  She lives in a one year old house.  There is no mildew in the home.  They have laminate flooring in the main living areas and carpeting in the bedroom.  They have electric heating and central cooling.  There are 3 dogs in the home. She is currently finishing her degree in history at a college in [place]. She is doing the rest of it remotely.  She is currently working as a Production assistant, radio at [employer].     Review of Systems: a 14-point review of systems is pertinent for what is mentioned in HPI.  Otherwise, all other systems were negative. Constitutional: negative other than that listed in the HPI Eyes: negative other than that listed in the HPI Ears, nose, mouth, throat, and face: negative other than that listed in the HPI Respiratory: negative other than that listed in the HPI Cardiovascular: negative other than that listed in the HPI Gastrointestinal: negative other than that listed in the HPI Genitourinary: negative other than that listed in the HPI Integument: negative other than that listed in the HPI Hematologic: negative other than that listed in the HPI Musculoskeletal: negative other than that listed in the HPI Neurological: negative other than that listed in the HPI Allergy/Immunologic: negative other than that listed in the HPI    Objective:   Blood pressure 108/72, pulse 74, temperature (!) 97.5 F (36.4 C), temperature source Oral, height 5' 4.3" (1.633 m), weight 138 lb (62.6 kg), last menstrual period 05/18/2018, SpO2 97 %. Body mass index is 23.47  kg/m.   Physical Exam:  General: Alert, interactive, in no acute distress. Thin appearing female.  Eyes: No conjunctival injection bilaterally, no discharge on the right, no discharge on the left and no Horner-Trantas dots present. PERRL bilaterally. EOMI without pain. No photophobia.  Ears: Right TM pearly gray with normal light reflex, Left TM pearly gray with normal light reflex, Right TM intact without perforation and Left TM intact without perforation.  Nose/Throat: External nose within normal limits and septum midline. Turbinates edematous and pale with clear discharge. Posterior oropharynx erythematous without cobblestoning in the posterior oropharynx. Tonsils 2+ without exudates.  Tongue without thrush. Neck: Supple without thyromegaly. Trachea midline. Adenopathy: no enlarged lymph nodes appreciated in the anterior cervical, occipital, axillary, epitrochlear, inguinal, or popliteal regions. Lungs: Clear to auscultation without wheezing, rhonchi or rales. No increased work of breathing. CV: Normal S1/S2. No murmurs. Capillary refill <2 seconds.  Abdomen: Nondistended, nontender. No guarding or rebound tenderness. Bowel sounds faint and hypoactive  Skin: Warm and dry, without lesions or rashes. Extremities:  No clubbing, cyanosis or edema. Neuro:   Grossly intact. No focal deficits appreciated. Responsive to questions.  Diagnostic studies:   Spirometry: results normal (FEV1: 3.20/97%, FVC: 3.85/101%, FEV1/FVC: 83%).    Spirometry consistent with normal pattern.   Allergy Studies: none (awaiting skin testing results from outside allergist)       Malachi Bonds, MD [facility]

## 2018-05-25 NOTE — Patient Instructions (Addendum)
1. IgG subclass deficiency - We will get your outside records and work on getting the immunoglobulin replacement approved. - It will probably take a couple of weeks to get this approved but we will work hard on this.  - We will likely change to Cuvitru because it can be infused a little quicker.   2. Chronic rhinitis - Hold off on the Flonase for one week to allow your nose to heal.  - Add on nasal saline rinses. - Try using Ayr nasal saline gel to keep your nasal passages moist.  - Continue with Xyzal daily. - We will get labs to check on your environmental allergies.   3. Acute non-recurrent maxillary sinusitis - With your current symptoms and time course, antibiotics might be needed: doxycycline 100mg  twice daily for 14 days  - If you think that you need it even with the nasal saline rinses, do ahead and start the antibiotic.   4. Mild persistent asthma, uncomplicated - Lung testing looks good today. - We will refill your albuterol today. - Use albuterol 2-4 puffs every 4-6 hours as needed.  5. Return in about 4 weeks (around 06/22/2018).   Please inform us of any Emergency Department visits, hospitalizations, or changes in symptoms. Call us before going to the ED for breathing or allergy symptoms since we might be able to fit you in for a sick visit. Feel free to contact us anytime with any questions, problems, or concerns.  It was a pleasure to meet you today!  Websites that have reliable patient information: 1. American Academy of Asthma, Allergy, and Immunology: www.aaaai.org 2. Food Allergy Research and Education (FARE): foodallergy.org 3. Mothers of Asthmatics: http://www.asthmacommunitynetwork.org 4. American College of Allergy, Asthma, and Immunology: MissingWeapons.cawww.acaai.org   Make sure you are registered to vote! If you have moved or changed any of your contact information, you will need to get this updated before voting!

## 2018-05-26 ENCOUNTER — Encounter: Payer: Self-pay | Admitting: Allergy & Immunology

## 2018-05-27 LAB — IGE+ALLERGENS ZONE 2(30)
Alternaria Alternata IgE: <0.1 kU/L
Amer Sycamore IgE Qn: <0.1 kU/L
Aspergillus Fumigatus IgE: <0.1 kU/L
Bahia Grass IgE: <0.1 kU/L
Cat Dander IgE: <0.1 kU/L
Cedar, Mountain IgE: <0.1 kU/L
Cladosporium Herbarum IgE: <0.1 kU/L
Cockroach, American IgE: <0.1 kU/L
D Farinae IgE: <0.1 kU/L
Elm, American IgE: <0.1 kU/L
IGE (IMMUNOGLOBULIN E), SERUM: 19 [IU]/mL (ref 6–495)
Maple/Box Elder IgE: <0.1 kU/L
Mucor Racemosus IgE: <0.1 kU/L
Nettle IgE: <0.1 kU/L
Pigweed, Rough IgE: <0.1 kU/L
Sheep Sorrel IgE Qn: <0.1 kU/L
Stemphylium Herbarum IgE: <0.1 kU/L
White Mulberry IgE: <0.1 kU/L

## 2018-05-27 NOTE — Progress Notes (Signed)
Medical release form has been re-faxed to requested facility to ensure proper delivery.

## 2018-05-27 NOTE — Addendum Note (Signed)
Addended by: Mliss FritzBLACK, Aine Strycharz I on: 05/27/2018 07:29 AM   Modules accepted: Orders

## 2018-06-01 ENCOUNTER — Telehealth: Payer: Self-pay | Admitting: *Deleted

## 2018-06-01 NOTE — Telephone Encounter (Signed)
Dr. Dellis AnesGallagher is requesting Streptococcal Pneumoniae titers from the patient's previous doctor. Called patient's previous Allergy and Asthma doctor and they stated they did not have the lab work since the blood work was not drawn at their office. Called patient and left a voicemail asking to return call to find out where the lab work was drawn so that we can request records from that office. This is needed in order to get the patient's Immunoglobulin approved.

## 2018-06-01 NOTE — Progress Notes (Signed)
[place]  In 2010, she had skin testing that was positive to trees, grasses, cockroach, and dog.  She had repeat testing performed in 2015 that was negative to the entire panel.    Evidently, around December 2011, she was noted to have a decreased total IgG with decreased IgG 1 with inadequate response to Streptococcus pneumonia.  She was boosted with Pneumovax and repeat titers were increased although the total response was still below normal limits at 50%.  She had normal responses to diphtheria and tetanus.  She was initially started on Gammagard 25 g every 4 weeks.  However, she was developing infections a few days before her next infusion, so she was switched to subcutaneous immunoglobulin around January 2013.  It seems that she has been worked up on a couple of occasions for immune deficiencies.  Her latest work-up occurred in 2018.  This was after she had apparently gone off of her immunoglobulin for quite some time.  Her Pneumovax was on October 21, 2016.  Labs from June 22, 2017 showed 12 out of 23 protective titers to cryptococcus pneumonia.  She also had decreased IgG2.  Labs from April 2017 showed low IgG2 as well and only 39% response to Streptococcus pneumonia.

## 2018-06-18 ENCOUNTER — Ambulatory Visit: Payer: 59 | Admitting: Family Medicine

## 2018-06-18 DIAGNOSIS — H00024 Hordeolum internum left upper eyelid: Secondary | ICD-10-CM | POA: Diagnosis not present

## 2018-06-18 DIAGNOSIS — Z0289 Encounter for other administrative examinations: Secondary | ICD-10-CM

## 2018-06-21 ENCOUNTER — Encounter: Payer: Self-pay | Admitting: Family Medicine

## 2018-06-21 ENCOUNTER — Ambulatory Visit: Payer: 59 | Admitting: Family Medicine

## 2018-06-21 VITALS — BP 100/68 | HR 74 | Temp 98.1°F | Resp 12 | Ht 65.0 in | Wt 140.2 lb

## 2018-06-21 DIAGNOSIS — R600 Localized edema: Secondary | ICD-10-CM

## 2018-06-21 DIAGNOSIS — J309 Allergic rhinitis, unspecified: Secondary | ICD-10-CM

## 2018-06-21 DIAGNOSIS — M542 Cervicalgia: Secondary | ICD-10-CM

## 2018-06-21 DIAGNOSIS — R519 Headache, unspecified: Secondary | ICD-10-CM

## 2018-06-21 DIAGNOSIS — R51 Headache: Secondary | ICD-10-CM

## 2018-06-21 MED ORDER — CYCLOBENZAPRINE HCL 10 MG PO TABS: 5.0000 mg | ORAL_TABLET | Freq: Every evening | ORAL | 0 refills | Status: AC | PRN

## 2018-06-21 NOTE — Progress Notes (Signed)
ACUTE VISIT   HPI:  Chief Complaint  Patient presents with  . Facial Swelling    painful eyes and neck pain  . Neck Pain    Melinda Gray is a 22 y.o. female, who is here today complaining of 3 days of peri-ocular puffiness and pressure sensation. She was evaluated in a local acute care facility and treated as conjunctivitis with ophthalmic abx. No new facial product or new exposure.  She has not noted facial rash or erythema.  Denies decreased urine output,gross hematuria,foam in urine,or LE edema.  Negative for cough,wheezing,or dyspnea.  Left frontal,occipital headache and facial pain. Pain is "not bad" but getting worse, 3/10.  Associated with nausea, no vomiting,photophobia,or visual changes.  Nasal congestion,post nasal drainage,and sore throat.  Neck stiffness and dull pain that started last night. Exacerbated by ROM. Alleviated by rest. No limitation of ROM.  She is concerned about meningitis.  She follows with immunologist,started Doxycycline 1-2 days ago. According to pt,Rx was given for her to keep and start if needed.  She reports fever,max temp 99 F 2 days ago.  LMP 1 week ago.  Review of Systems  Constitutional: Positive for fatigue. Negative for activity change, appetite change and fever.  HENT: Positive for congestion, facial swelling, nosebleeds (mild), postnasal drip, rhinorrhea, sinus pressure and sore throat. Negative for ear pain, mouth sores, trouble swallowing and voice change.   Eyes: Negative for discharge, redness and itching.  Respiratory: Positive for cough. Negative for shortness of breath and wheezing.   Cardiovascular: Negative.   Gastrointestinal: Positive for nausea. Negative for abdominal pain, diarrhea and vomiting.  Musculoskeletal: Positive for neck pain and neck stiffness. Negative for gait problem and joint swelling.  Skin: Negative for color change and rash.  Allergic/Immunologic: Positive for environmental  allergies and immunocompromised state.  Neurological: Positive for headaches. Negative for syncope, weakness and numbness.  Hematological: Negative for adenopathy. Does not bruise/bleed easily.  Psychiatric/Behavioral: Negative for confusion. The patient is nervous/anxious.       Current Outpatient Medications on File Prior to Visit  Medication Sig Dispense Refill  . albuterol (PROVENTIL HFA;VENTOLIN HFA) 108 (90 Base) MCG/ACT inhaler Inhale 2 puffs into the lungs every 4 (four) hours as needed for wheezing or shortness of breath. 18 g 2  . fluticasone (FLONASE) 50 MCG/ACT nasal spray Place 2 sprays into both nostrils daily as needed for allergies or rhinitis. 16 g 2  . Immune Globulin, Human, (HIZENTRA [place]) Inject 16 mg into the skin.    Marland Kitchen NUVARING 0.12-0.015 MG/24HR vaginal ring Place 1 each vaginally every 28 (twenty-eight) days.   11  . nystatin cream (MYCOSTATIN) Apply 1 application topically 2 (two) times daily. 30 g 0  . nystatin-triamcinolone (MYCOLOG II) cream Apply 1 application topically 2 (two) times daily as needed. External use. 45 g 0  . ondansetron (ZOFRAN-ODT) 4 MG disintegrating tablet Take 1 tablet (4 mg total) by mouth every 8 (eight) hours as needed for nausea or vomiting. 20 tablet 0  . SUMAtriptan (IMITREX) 25 MG tablet Take 1 tablet (25 mg total) by mouth every 12 (twelve) hours as needed for migraine or headache. May repeat in 2 hours if headache persists or recurs. 10 tablet 0   No current facility-administered medications on file prior to visit.      Past Medical History:  Diagnosis Date  . Allergy   . Asthma   . Chronic bronchitis (HCC)   . Depression   . Frequent headaches   .  GERD (gastroesophageal reflux disease)   . Primary immune deficiency disorder (HCC)   . Recurrent upper respiratory infection (URI)   . Urticaria    Allergies  Allergen Reactions  . Amoxicillin Nausea And Vomiting and Rash  . Amoxicillin-Pot Clavulanate Nausea And Vomiting and  Rash    Other reaction(s): Dizziness    Social History   Socioeconomic History  . Marital status: Single    Spouse name: Not on file  . Number of children: Not on file  . Years of education: Not on file  . Highest education level: Not on file  Occupational History  . Not on file  Social Needs  . Financial resource strain: Not hard at all  . Food insecurity:    Worry: Never true    Inability: Never true  . Transportation needs:    Medical: No    Non-medical: No  Tobacco Use  . Smoking status: Never Smoker  . Smokeless tobacco: Never Used  Substance and Sexual Activity  . Alcohol use: Yes    Frequency: Never    Comment: occasional  . Drug use: Never  . Sexual activity: Yes    Partners: Male    Birth control/protection: Inserts  Lifestyle  . Physical activity:    Days per week: Not on file    Minutes per session: Not on file  . Stress: Only a little  Relationships  . Social connections:    Talks on phone: More than three times a week    Gets together: More than three times a week    Attends religious service: Not on file    Active member of club or organization: Yes    Attends meetings of clubs or organizations: 1 to 4 times per year    Relationship status: Never married  Other Topics Concern  . Not on file  Social History Narrative  . Not on file    Vitals:   06/21/18 1511  BP: 100/68  Pulse: 74  Resp: 12  Temp: 98.1 F (36.7 C)  SpO2: 98%   Body mass index is 23.84 kg/m.   Physical Exam  Nursing note and vitals reviewed. Constitutional: She is oriented to person, place, and time. She appears well-developed and well-nourished. She does not appear ill. No distress.  HENT:  Head: Normocephalic and atraumatic.  Right Ear: Tympanic membrane, external ear and ear canal normal.  Left Ear: Tympanic membrane, external ear and ear canal normal.  Nose: Right sinus exhibits no maxillary sinus tenderness and no frontal sinus tenderness. Left sinus exhibits no  maxillary sinus tenderness and no frontal sinus tenderness.  Mouth/Throat: Oropharynx is clear and moist and mucous membranes are normal.  Eyes: Pupils are equal, round, and reactive to light. Conjunctivae and EOM are normal.  Upper palpebral minimal edema. I do not appreciate facial edema.  Neck: No muscular tenderness present. No edema and no erythema present. No Brudzinski's sign and no Kernig's sign noted.  Cardiovascular: Normal rate and regular rhythm.  No murmur heard. Respiratory: Effort normal and breath sounds normal. No stridor. No respiratory distress.  GI: Soft. She exhibits no mass. There is no hepatomegaly. There is no tenderness.  Musculoskeletal: She exhibits no edema.       Cervical back: She exhibits tenderness. She exhibits normal range of motion and no bony tenderness.       Back:  Tenderness upon palpation or cervical paraspinal muscles.   Lymphadenopathy:       Head (right side): No submandibular adenopathy present.  Head (left side): No submandibular adenopathy present.    She has no cervical adenopathy.  Neurological: She is alert and oriented to person, place, and time. She has normal strength. No cranial nerve deficit. Gait normal.  Reflex Scores:      Patellar reflexes are 2+ on the right side and 2+ on the left side. Skin: Skin is warm. No rash noted. No erythema.  Psychiatric: Her mood appears anxious.  Well groomed, good eye contact.     ASSESSMENT AND PLAN:  Elonda HuskyCassandra was seen today for facial swelling and neck pain.  Diagnoses and all orders for this visit:  Lab Results  Component Value Date   WBC 5.8 06/21/2018   HGB 13.0 06/21/2018   HCT 38.8 06/21/2018   MCV 83.3 06/21/2018   PLT 299.0 06/21/2018   Lab Results  Component Value Date   CREATININE 0.62 06/21/2018   BUN 9 06/21/2018   NA 138 06/21/2018   K 4.3 06/21/2018   CL 104 06/21/2018   CO2 26 06/21/2018     Facial edema On examination today I noted mild palpebral edema  but no generalized facial edema or sinus tenderness. Continue abx treatment until she follows with her immunologist.  -     Basic metabolic panel -     CBC with Differential/Platelet  Cervicalgia It seems to be musculoskeletal. Because no Hx of trauma I do not think neck X ray is needed today. Recommend local massage and muscle relaxant,side effects discussed.  -     cyclobenzaprine (FLEXERIL) 10 MG tablet; Take 0.5-1 tablets (5-10 mg total) by mouth at bedtime as needed for muscle spasms.  Headache, unspecified headache type Hx and examination do not suggest meningitis. I do not think she needs to go to the ER at this time. Clearly instructed about warning signs.  -     CBC with Differential/Platelet  Allergic rhinitis, unspecified seasonality, unspecified trigger Some of her symptoms could be related to allergic rhinitis and/or viral illness. Nasal saline  irrigations a few times per day may help.      Return if symptoms worsen or fail to improve.     -Ms.Tulani Memory DanceMarie Fujii was advised to seek immediate medical attention if sudden worsening symptoms.      Buffie Herne G. Swazil[place]Jordan, MD  [facility]. [place] office.

## 2018-06-21 NOTE — Patient Instructions (Addendum)
A few things to remember from today's visit:   Facial edema - Plan: Basic metabolic panel, CBC with Differential/Platelet  Cervicalgia  Headache, unspecified headache type - Plan: CBC with Differential/Platelet  If 1 of these labs is very abnormal we may need to send you to the ER, so keep your phone on all the time in case we need to contact you.   Keep appointment with your immunologist tomorrow. Muscle relaxants may help with neck pain and headache, they can cause drowsiness. Flexeril 5 to 10 mg at bedtime may help with neck pain and headache.  Local massage and heat.  Please be sure medication list is accurate. If a new problem present, please set up appointment sooner than planned today.

## 2018-06-22 ENCOUNTER — Encounter: Payer: Self-pay | Admitting: Family Medicine

## 2018-06-22 ENCOUNTER — Ambulatory Visit: Payer: 59 | Admitting: Allergy & Immunology

## 2018-06-22 ENCOUNTER — Encounter: Payer: Self-pay | Admitting: Allergy & Immunology

## 2018-06-22 VITALS — BP 92/58 | HR 75 | Resp 16 | Ht 65.0 in | Wt 137.4 lb

## 2018-06-22 DIAGNOSIS — J4520 Mild intermittent asthma, uncomplicated: Secondary | ICD-10-CM | POA: Diagnosis not present

## 2018-06-22 DIAGNOSIS — R58 Hemorrhage, not elsewhere classified: Secondary | ICD-10-CM | POA: Diagnosis not present

## 2018-06-22 DIAGNOSIS — D803 Selective deficiency of immunoglobulin G [IgG] subclasses: Secondary | ICD-10-CM

## 2018-06-22 LAB — CBC WITH DIFFERENTIAL/PLATELET
BASOS ABS: 0.1 10*3/uL (ref 0.0–0.1)
Basophils Relative: 1.3 % (ref 0.0–3.0)
Eosinophils Absolute: 0.2 10*3/uL (ref 0.0–0.7)
Eosinophils Relative: 2.8 % (ref 0.0–5.0)
HCT: 38.8 % (ref 36.0–46.0)
Hemoglobin: 13 g/dL (ref 12.0–15.0)
Lymphocytes Relative: 28.8 % (ref 12.0–46.0)
Lymphs Abs: 1.7 10*3/uL (ref 0.7–4.0)
MCHC: 33.5 g/dL (ref 30.0–36.0)
MCV: 83.3 fl (ref 78.0–100.0)
MONOS PCT: 7.4 % (ref 3.0–12.0)
Monocytes Absolute: 0.4 10*3/uL (ref 0.1–1.0)
Neutro Abs: 3.5 10*3/uL (ref 1.4–7.7)
Neutrophils Relative %: 59.7 % (ref 43.0–77.0)
Platelets: 299 10*3/uL (ref 150.0–400.0)
RBC: 4.65 Mil/uL (ref 3.87–5.11)
RDW: 13.4 % (ref 11.5–15.5)
WBC: 5.8 10*3/uL (ref 4.0–10.5)

## 2018-06-22 LAB — BASIC METABOLIC PANEL
BUN: 9 mg/dL (ref 6–23)
CO2: 26 mEq/L (ref 19–32)
Calcium: 9.2 mg/dL (ref 8.4–10.5)
Chloride: 104 mEq/L (ref 96–112)
Creatinine, Ser: 0.62 mg/dL (ref 0.40–1.20)
GFR: 126.24 mL/min (ref >60.00)
Glucose, Bld: 86 mg/dL (ref 70–99)
Potassium: 4.3 mEq/L (ref 3.5–5.1)
Sodium: 138 mEq/L (ref 135–145)

## 2018-06-22 NOTE — Patient Instructions (Addendum)
1. IgG subclass deficiency - We are still having problems with getting the immunoglobulin approved. - Tammy is working very hard on this, and we will contact you when we have this approved. - We do not need more immune labs at this point.   2. Chronic rhinitis - Continue with the nasal saline gel.  - Continue with Xyzal daily.    3. Mild persistent asthma, uncomplicated - Lung testing looks good today. - We will refill your albuterol today.  - Use albuterol 2-4 puffs every 4-6 hours as needed.  4. Heavy bleeding - We are going to get some labs to look for von Willebrand disease. - We will look for iron studies to make sure that this is normal.   5. Return in about 3 months (around 09/21/2018).   Please inform us of any Emergency Department visits, hospitalizations, or changes in symptoms. Call us before going to the ED for breathing or allergy symptoms since we might be able to fit you in for a sick visit. Feel free to contact us anytime with any questions, problems, or concerns.  It was a pleasure to see you again today!  Websites that have reliable patient information: 1. American Academy of Asthma, Allergy, and Immunology: www.aaaai.org 2. Food Allergy Research and Education (FARE): foodallergy.org 3. Mothers of Asthmatics: http://www.asthmacommunitynetwork.org 4. American College of Allergy, Asthma, and Immunology: MissingWeapons.cawww.acaai.org   Make sure you are registered to vote! If you have moved or changed any of your contact information, you will need to get this updated before voting!

## 2018-06-22 NOTE — Progress Notes (Signed)
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 Von Willebrand deficiency)  Plan/Recommendations:   1. IgG subclass deficiency - We are still having problems with getting the immunoglobulin approved. - Tammy is working very hard on this, and we will contact you when we have this approved. - We do not need more immune labs at this point.   2. Chronic rhinitis - Continue with the nasal saline gel.  - Continue with Xyzal daily.    3. Mild persistent asthma, uncomplicated - Lung testing looks good today. - We will refill your albuterol today.  - Use albuterol 2-4 puffs every 4-6 hours as needed.  4. Heavy bleeding - We are going to get some labs to look for von Willebrand disease. - We will look for iron studies to make sure that this is normal.   5. Return in about 3 months (around 09/21/2018).   Subjective:   Melinda Gray is a 22 y.o. female presenting today for follow up of  Chief Complaint  Patient presents with  . Asthma    Melinda Gray has a history of the following: Patient Active Problem List   Diagnosis Date Noted  . Migraine headache without aura 05/18/2018  . Allergic rhinitis 05/18/2018  . GERD (gastroesophageal reflux disease) 05/18/2018  . Aseptic meningitis 05/07/2018  . Primary immune deficiency disorder (HCC) 05/06/2018  . SIRS (systemic inflammatory response syndrome) (HCC) 05/05/2018    History obtained from: chart review and patient.  [name] Primary Care Provider is [place], Betty G, MD.     Shacola is a 22 y.o. female presenting for a follow up visit.  She has a history of IgG subclass deficiency, and unfortunately has gone several years without any immunoglobulin replacement.  We saw her in November 2019 as a new patient  and we worked on collecting all of her labs to confirm her disease.  We have submitted for approval of Hizentra 16 g every 2 weeks, which comes out to around 511 mg/kg/month.  However, the PA is still pending.  We cannot get repeat labs at her visit since she had received IVIG within the past month.  Since the last visit, she has mostly done well. She did develop some swelling around her right eye and then it spread to the left side. She started the doxycycline from the last visit and the swelling as improved. She did have a low grade fever up to 99.  The swelling of her face has markedly improved and she is planning to complete the entire course of doxycycline.  Her breathing is not been an issue.  She does have plenty of albuterol, but has not needed any since last visit.  Aside from the episode of possible periorbital cellulitis, she has not needed any antibiotics.  She continues to hold her fluticasone should she has a history of nosebleeds.  She has been using the nasal saline gel.  She does report today that she has bleeding from needle sticks for approximately 1-1/2 hours or more.  She also reports heavy periods since she first went through menarche.  However, she now has NuvaRing which has made her periods essentially absent.  There is no family history of clotting disorders.  There is no history specifically of von Willebrand disease.  Her platelet counts have always been  fairly normal in the 250-300 range.  Otherwise, there have been no changes to her past medical history, surgical history, family history, or social history.  She is planning to transfer to [employer].  This will be slightly more expensive than where [place] is attending school in [place], but she will not need to move back to [place].  She was afraid that she would have to do that to complete her degree.  If she did move back, it would take around 2 years to complete.  However, with transferring to [employer], it  will take around 1 year.   Review of Systems: a 14-point review of systems is pertinent for what is mentioned in HPI.  Otherwise, all other systems were negative.  Constitutional: negative other than that listed in the HPI Eyes: negative other than that listed in the HPI Ears, nose, mouth, throat, and face: negative other than that listed in the HPI Respiratory: negative other than that listed in the HPI Cardiovascular: negative other than that listed in the HPI Gastrointestinal: negative other than that listed in the HPI Genitourinary: negative other than that listed in the HPI Integument: negative other than that listed in the HPI Hematologic: negative other than that listed in the HPI Musculoskeletal: negative other than that listed in the HPI Neurological: negative other than that listed in the HPI Allergy/Immunologic: negative other than that listed in the HPI    Objective:   Blood pressure (!) 92/58, pulse 75, resp. rate 16, height 5\' 5"  (1.651 m), weight 137 lb 6.4 oz (62.3 kg), SpO2 97 %. Body mass index is 22.86 kg/m.   Physical Exam:  General: Alert, interactive, in no acute distress.  Talkative. Eyes: No conjunctival injection bilaterally, no discharge on the right, no discharge on the left and no Horner-Trantas dots present. PERRL bilaterally. EOMI without pain. No photophobia.  Ears: Right TM pearly gray with normal light reflex, Left TM pearly gray with normal light reflex, Right TM intact without perforation and Left TM intact without perforation.  Nose/Throat: External nose within normal limits and septum midline. Turbinates edematous and pale with clear discharge. Posterior oropharynx erythematous with cobblestoning in the posterior oropharynx. Tonsils 2+ without exudates.  Tongue without thrush. Lungs: Clear to auscultation without wheezing, rhonchi or rales. No increased work of breathing. CV: Normal S1/S2. No murmurs. Capillary refill <2 seconds.  Skin: Warm and  dry, without lesions or rashes. Neuro:   Grossly intact. No focal deficits appreciated. Responsive to questions.  Diagnostic studies:   Spirometry: results normal (FEV1: 3.92/100%, FVC: 74%, FEV1/FVC: 74%).    Spirometry consistent with normal pattern.   Allergy Studies: none      Malachi BondsJoel Gallagher, MD  [facility]

## 2018-06-23 LAB — IRON,TIBC AND FERRITIN PANEL
Ferritin: 102 ng/mL (ref 15–150)
IRON: 138 ug/dL (ref 27–159)
Iron Saturation: 35 % (ref 15–55)
Total Iron Binding Capacity: 392 ug/dL (ref 250–450)
UIBC: 254 ug/dL (ref 131–425)

## 2018-06-23 LAB — COAG STUDIES INTERP REPORT

## 2018-06-23 LAB — VON WILLEBRAND PANEL
FACTOR VIII ACTIVITY: 182 % — AB (ref 56–140)
Von Willebrand Ag: 147 % (ref 50–200)
Von Willebrand Factor: 129 % (ref 50–200)

## 2018-07-30 ENCOUNTER — Ambulatory Visit: Payer: 59 | Admitting: Neurology

## 2018-08-03 ENCOUNTER — Encounter: Payer: Self-pay | Admitting: Family Medicine

## 2018-08-03 ENCOUNTER — Ambulatory Visit: Payer: 59 | Admitting: Family Medicine

## 2018-08-03 VITALS — BP 104/60 | HR 84 | Temp 98.5°F | Resp 12 | Ht 65.0 in | Wt 144.0 lb

## 2018-08-03 DIAGNOSIS — R110 Nausea: Secondary | ICD-10-CM

## 2018-08-03 DIAGNOSIS — J029 Acute pharyngitis, unspecified: Secondary | ICD-10-CM | POA: Diagnosis not present

## 2018-08-03 DIAGNOSIS — J069 Acute upper respiratory infection, unspecified: Secondary | ICD-10-CM | POA: Diagnosis not present

## 2018-08-03 LAB — POC INFLUENZA A&B (BINAX/QUICKVUE)
Influenza A, POC: NEGATIVE
Influenza B, POC: NEGATIVE

## 2018-08-03 LAB — POCT RAPID STREP A (OFFICE): RAPID STREP A SCREEN: NEGATIVE

## 2018-08-03 MED ORDER — MAGIC MOUTHWASH W/LIDOCAINE: 5.0000 mL | Freq: Three times a day (TID) | ORAL | 0 refills | Status: AC | PRN

## 2018-08-03 MED ORDER — ONDANSETRON 4 MG PO TBDP: 4.0000 mg | ORAL_TABLET | Freq: Three times a day (TID) | ORAL | 0 refills | Status: AC | PRN

## 2018-08-03 NOTE — Patient Instructions (Addendum)
A few things to remember from today's visit:   URI, acute - Plan: POC Influenza A&B(BINAX/QUICKVUE)  Sore throat - Plan: POC Rapid Strep A  Nausea without vomiting  viral infections are self-limited and we treat each symptom depending of severity.  Over the counter medications as decongestants and cold medications usually help, they need to be taken with caution if there is a history of high blood pressure or palpitations. Tylenol and/or Ibuprofen also helps with most symptoms (headache, muscle aching, fever,etc) Plenty of fluids. Honey helps with cough. Steam inhalations helps with runny nose, nasal congestion, and may prevent sinus infections. Cough and nasal congestion could last a few days and sometimes weeks. Please follow in not any better in 1-2 weeks or if symptoms get worse.  Please be sure medication list is accurate. If a new problem present, please set up appointment sooner than planned today.

## 2018-08-03 NOTE — Progress Notes (Signed)
ACUTE VISIT  HPI:  Chief Complaint  Patient presents with  . Illness    Pt woke with sx.  Fever, fatigue, sore throat, ear pain, nasal congestion, headache, cough and body aches.  . Sore Throat    Melinda Gray is a 23 y.o.female here today complaining of a day of respiratory symptoms. Concern about strep infection because this morning her mother noticed a rash around her neck.  Non-productive cough, nasal congestion, rhinorrhea, postnasal drainage, and ear ache. Frontal pressure headache, not associated with visual changes or focal deficit.  She has history of chronic headaches.  Sore throat aggravated with swallowing and coughing. She denies dysphasia or stridor. She has not noted dyspnea or wheezing.  Last night temperature 99 F, "for me this is 100."   URI   This is a new problem. The current episode started yesterday. The problem has been unchanged. Associated symptoms include congestion, coughing, headaches, nausea, a plugged ear sensation, a rash, rhinorrhea and a sore throat. Pertinent negatives include no abdominal pain, diarrhea, ear pain, sinus pain, swollen glands, vomiting or wheezing. She has tried nothing for the symptoms.   "A lot off" nausea, she has taken Zofran. She denies abdominal pain, vomiting, changes in bowel habits, or urinary symptoms.   No Hx of recent travel. No sick contact. No known insect bite.  Hx of allergies: Yes, she uses Flonase nasal spray as needed. History of asthma, she has not noted dyspnea or wheezing.  OTC medications for this problem: None    Review of Systems  Constitutional: Positive for chills, fatigue and fever. Negative for activity change and appetite change.  HENT: Positive for congestion, postnasal drip, rhinorrhea and sore throat. Negative for ear pain, mouth sores, sinus pressure, sinus pain, trouble swallowing and voice change.   Eyes: Negative for discharge, redness and visual disturbance.    Respiratory: Positive for cough. Negative for shortness of breath and wheezing.   Cardiovascular: Negative.   Gastrointestinal: Positive for nausea. Negative for abdominal pain, diarrhea and vomiting.  Musculoskeletal: Positive for myalgias. Negative for gait problem and neck stiffness.  Skin: Positive for rash.  Allergic/Immunologic: Positive for environmental allergies.  Neurological: Positive for headaches. Negative for syncope, facial asymmetry and weakness.  Hematological: Negative for adenopathy. Does not bruise/bleed easily.  Psychiatric/Behavioral: Negative for confusion. The patient is nervous/anxious.       Current Outpatient Medications on File Prior to Visit  Medication Sig Dispense Refill  . albuterol (PROVENTIL HFA;VENTOLIN HFA) 108 (90 Base) MCG/ACT inhaler Inhale 2 puffs into the lungs every 4 (four) hours as needed for wheezing or shortness of breath. 18 g 2  . fluticasone (FLONASE) 50 MCG/ACT nasal spray Place 2 sprays into both nostrils daily as needed for allergies or rhinitis. 16 g 2  . Immune Globulin, Human, (HIZENTRA [place]) Inject 16 mg into the skin.    Marland Kitchen. NUVARING 0.12-0.015 MG/24HR vaginal ring Place 1 each vaginally every 28 (twenty-eight) days.   11  . SUMAtriptan (IMITREX) 25 MG tablet Take 1 tablet (25 mg total) by mouth every 12 (twelve) hours as needed for migraine or headache. May repeat in 2 hours if headache persists or recurs. 10 tablet 0   No current facility-administered medications on file prior to visit.      Past Medical History:  Diagnosis Date  . Allergy   . Asthma   . Chronic bronchitis (HCC)   . Depression   . Frequent headaches   . GERD (gastroesophageal  reflux disease)   . Primary immune deficiency disorder (HCC)   . Recurrent upper respiratory infection (URI)   . Urticaria    Allergies  Allergen Reactions  . Amoxicillin Nausea And Vomiting and Rash  . Amoxicillin-Pot Clavulanate Nausea And Vomiting and Rash    Other reaction(s):  Dizziness    Social History   Socioeconomic History  . Marital status: Single    Spouse name: Not on file  . Number of children: Not on file  . Years of education: Not on file  . Highest education level: Not on file  Occupational History  . Not on file  Social Needs  . Financial resource strain: Not hard at all  . Food insecurity:    Worry: Never true    Inability: Never true  . Transportation needs:    Medical: No    Non-medical: No  Tobacco Use  . Smoking status: Never Smoker  . Smokeless tobacco: Never Used  Substance and Sexual Activity  . Alcohol use: Yes    Frequency: Never    Comment: occasional  . Drug use: Never  . Sexual activity: Yes    Partners: Male    Birth control/protection: Inserts  Lifestyle  . Physical activity:    Days per week: Not on file    Minutes per session: Not on file  . Stress: Only a little  Relationships  . Social connections:    Talks on phone: More than three times a week    Gets together: More than three times a week    Attends religious service: Not on file    Active member of club or organization: Yes    Attends meetings of clubs or organizations: 1 to 4 times per year    Relationship status: Never married  Other Topics Concern  . Not on file  Social History Narrative  . Not on file    Vitals:   08/03/18 1109  BP: 104/60  Pulse: 84  Resp: 12  Temp: 98.5 F (36.9 C)   Body mass index is 23.96 kg/m.   Physical Exam  Nursing note and vitals reviewed. Constitutional: She is oriented to person, place, and time. She appears well-developed and well-nourished. She does not appear ill. No distress.  HENT:  Head: Normocephalic and atraumatic.  Right Ear: Tympanic membrane, external ear and ear canal normal.  Left Ear: Tympanic membrane, external ear and ear canal normal.  Nose: Rhinorrhea present. Right sinus exhibits no maxillary sinus tenderness and no frontal sinus tenderness. Left sinus exhibits no maxillary sinus  tenderness and no frontal sinus tenderness.  Mouth/Throat: Oropharynx is clear and moist and mucous membranes are normal.  Nasal congestion. Postnasal drainage.  Eyes: Pupils are equal, round, and reactive to light. Conjunctivae are normal.  Neck: Neck supple. No muscular tenderness present. No edema and no erythema present.  Cardiovascular: Normal rate and regular rhythm.  No murmur heard. Respiratory: Effort normal and breath sounds normal. No stridor. No respiratory distress.  Lymphadenopathy:       Head (right side): No submandibular adenopathy present.       Head (left side): No submandibular adenopathy present.    She has no cervical adenopathy.  Neurological: She is alert and oriented to person, place, and time. She has normal strength.  Skin: Skin is warm. No rash (I do not appreciate rash) noted. No erythema.  Psychiatric: Her mood appears anxious.  Well groomed, good eye contact.    ASSESSMENT AND PLAN:   Melinda Gray was seen  today for illness and sore throat.  Diagnoses and all orders for this visit:  URI, acute Rapid flu test here in the office negative. Explained that symptoms suggests a viral etiology, so symptomatic treatment recommended at this time.  Instructed to monitor for signs of complications,clearly instructed about warning signs. Continue monitor temperature, instructed to let me know if temp > 100 F. I also explained that cough and nasal congestion can last a few days and sometimes weeks. F/U as needed.   -     POC Influenza A&B(BINAX/QUICKVUE)  Sore throat Rapid strep here in the office negative. We will follow strep culture. Clinical findings do not suggest strep infection. Symptomatic treatment with throat lozenges and/or gargles with Magic mouthwash with lidocaine recommended. Instructed about warning signs.  -     POC Rapid Strep A -     magic mouthwash w/lidocaine SOLN; Take 5 mLs by mouth 3 (three) times daily as needed for up to 10 days for  mouth pain. 50 ml of diphenhydramine, alum and mag hydroxide, and lidocaine to make 150 ml -     Culture, Group A Strep  Nausea without vomiting Small and frequent sips of clear fluids. Continue Zofran 4 mg 3 times daily as needed.  -     ondansetron (ZOFRAN-ODT) 4 MG disintegrating tablet; Take 1 tablet (4 mg total) by mouth every 8 (eight) hours as needed for up to 3 days for nausea or vomiting.    Return if symptoms worsen or fail to improve.       Melinda Bousquet G. [place], MD  [facility]. [place] office.

## 2018-08-06 ENCOUNTER — Encounter: Payer: Self-pay | Admitting: Allergy & Immunology

## 2018-08-06 ENCOUNTER — Ambulatory Visit: Payer: 59 | Admitting: Family Medicine

## 2018-08-06 ENCOUNTER — Encounter: Payer: Self-pay | Admitting: Family Medicine

## 2018-08-06 ENCOUNTER — Other Ambulatory Visit: Payer: Self-pay | Admitting: *Deleted

## 2018-08-06 ENCOUNTER — Encounter: Payer: Self-pay | Admitting: *Deleted

## 2018-08-06 VITALS — BP 110/70 | HR 75 | Temp 98.3°F | Resp 12 | Ht 65.0 in | Wt 144.1 lb

## 2018-08-06 DIAGNOSIS — R51 Headache: Principal | ICD-10-CM

## 2018-08-06 DIAGNOSIS — M542 Cervicalgia: Secondary | ICD-10-CM

## 2018-08-06 DIAGNOSIS — J309 Allergic rhinitis, unspecified: Secondary | ICD-10-CM

## 2018-08-06 DIAGNOSIS — R519 Headache, unspecified: Secondary | ICD-10-CM

## 2018-08-06 DIAGNOSIS — J069 Acute upper respiratory infection, unspecified: Secondary | ICD-10-CM | POA: Diagnosis not present

## 2018-08-06 MED ORDER — PREDNISONE 20 MG PO TABS: 40.0000 mg | ORAL_TABLET | Freq: Every day | ORAL | 0 refills | Status: AC

## 2018-08-06 MED ORDER — DOXYCYCLINE HYCLATE 100 MG PO TABS: 100.0000 mg | ORAL_TABLET | Freq: Two times a day (BID) | ORAL | 0 refills | Status: AC

## 2018-08-06 MED ORDER — KETOROLAC TROMETHAMINE 60 MG/2ML IM SOLN
60.0000 mg | Freq: Once | INTRAMUSCULAR | Status: AC
  Administered 2018-08-06: 60 mg via INTRAMUSCULAR

## 2018-08-06 MED ORDER — METHOCARBAMOL 500 MG PO TABS: 500.0000 mg | ORAL_TABLET | Freq: Three times a day (TID) | ORAL | 0 refills | Status: AC | PRN

## 2018-08-06 MED ORDER — DICLOFENAC SODIUM 75 MG PO TBEC: 75.0000 mg | DELAYED_RELEASE_TABLET | Freq: Two times a day (BID) | ORAL | 0 refills | Status: DC

## 2018-08-06 MED ORDER — DICLOFENAC SODIUM 75 MG PO TBEC: 75.0000 mg | DELAYED_RELEASE_TABLET | Freq: Two times a day (BID) | ORAL | 0 refills | Status: AC

## 2018-08-06 NOTE — Patient Instructions (Addendum)
A few things to remember from today's visit:   Headache, unspecified headache type - Plan: diclofenac (VOLTAREN) 75 MG EC tablet  Allergic rhinitis, unspecified seasonality, unspecified trigger - Plan: predniSONE (DELTASONE) 20 MG tablet  URI, acute  Cervicalgia - Plan: diclofenac (VOLTAREN) 75 MG EC tablet, methocarbamol (ROBAXIN) 500 MG tablet  I do not think antibiotic is needed at this time, it seems like symptoms are related to a viral illness. If headache is suddenly worse, you need to seek immediate medical attention. If you are not any better in 3 to 4 days you can start antibiotic treatment. Take medication with food.

## 2018-08-06 NOTE — Progress Notes (Signed)
ACUTE VISIT   HPI:  Chief Complaint  Patient presents with  . Headache  . Neck Pain    neck stiffness  . Nasal Congestion    with green mucus    Melinda Gray is a 23 y.o. female with history of primary immune deficiency disorder, migraine headaches, and a rhinitis here today with her boyfriend complaining of headache,cervicalgia,and intermittent fever. Max temp 99 F. Non productive cough,nasal congestion,rhinorrhea. Sore throat aggravated by coughing.  She is afraid of having meningitis. He of aseptic meningitis a few years ago.  She was seen on 08/03/2018, when she was complaining about a day of respiratory symptoms. Headache is sometimes frontal,parietal,and occipital. Dull 6-7/10.  + Photosensitivity but no other visual change. Hx of migraines. She took Ibuprofen yesterday.  Nausea, no vomiting or diarrhea. Upper abdominal pain,mild,not radiated. No exacerbating factors identified. Alleviated by burping.  She saw a mild rash around her ankle last night but resolved.   Review of Systems  Constitutional: Positive for activity change, appetite change, chills, fatigue and fever.  HENT: Positive for congestion, postnasal drip, rhinorrhea, sinus pressure and sore throat. Negative for ear pain, mouth sores, trouble swallowing and voice change.   Eyes: Positive for photophobia. Negative for discharge and redness.  Respiratory: Positive for cough. Negative for shortness of breath and wheezing.   Cardiovascular: Negative for palpitations and leg swelling.  Gastrointestinal: Positive for abdominal pain and nausea. Negative for blood in stool, diarrhea and vomiting.  Genitourinary: Negative for decreased urine volume and hematuria.  Musculoskeletal: Positive for myalgias and neck pain. Negative for gait problem and joint swelling.  Skin: Negative for rash.  Allergic/Immunologic: Positive for environmental allergies.  Neurological: Positive for headaches.  Negative for syncope, weakness and numbness.  Hematological: Negative for adenopathy. Does not bruise/bleed easily.  Psychiatric/Behavioral: Negative for confusion. The patient is nervous/anxious.       Current Outpatient Medications on File Prior to Visit  Medication Sig Dispense Refill  . albuterol (PROVENTIL HFA;VENTOLIN HFA) 108 (90 Base) MCG/ACT inhaler Inhale 2 puffs into the lungs every 4 (four) hours as needed for wheezing or shortness of breath. 18 g 2  . fluticasone (FLONASE) 50 MCG/ACT nasal spray Place 2 sprays into both nostrils daily as needed for allergies or rhinitis. 16 g 2  . Immune Globulin, Human, (HIZENTRA [place]) Inject 16 mg into the skin.    Marland Kitchen. NUVARING 0.12-0.015 MG/24HR vaginal ring Place 1 each vaginally every 28 (twenty-eight) days.   11  . SUMAtriptan (IMITREX) 25 MG tablet Take 1 tablet (25 mg total) by mouth every 12 (twelve) hours as needed for migraine or headache. May repeat in 2 hours if headache persists or recurs. 10 tablet 0  . magic mouthwash w/lidocaine SOLN Take 5 mLs by mouth 3 (three) times daily as needed for up to 10 days for mouth pain. 50 ml of diphenhydramine, alum and mag hydroxide, and lidocaine to make 150 ml (Patient not taking: Reported on 08/06/2018) 150 mL 0   No current facility-administered medications on file prior to visit.      Past Medical History:  Diagnosis Date  . Allergy   . Asthma   . Chronic bronchitis (HCC)   . Depression   . Frequent headaches   . GERD (gastroesophageal reflux disease)   . Primary immune deficiency disorder (HCC)   . Recurrent upper respiratory infection (URI)   . Urticaria    Allergies  Allergen Reactions  . Amoxicillin Nausea And Vomiting and  Rash  . Amoxicillin-Pot Clavulanate Nausea And Vomiting and Rash    Other reaction(s): Dizziness    Social History   Socioeconomic History  . Marital status: Single    Spouse name: Not on file  . Number of children: Not on file  . Years of education:  Not on file  . Highest education level: Not on file  Occupational History  . Not on file  Social Needs  . Financial resource strain: Not hard at all  . Food insecurity:    Worry: Never true    Inability: Never true  . Transportation needs:    Medical: No    Non-medical: No  Tobacco Use  . Smoking status: Never Smoker  . Smokeless tobacco: Never Used  Substance and Sexual Activity  . Alcohol use: Yes    Frequency: Never    Comment: occasional  . Drug use: Never  . Sexual activity: Yes    Partners: Male    Birth control/protection: Inserts  Lifestyle  . Physical activity:    Days per week: Not on file    Minutes per session: Not on file  . Stress: Only a little  Relationships  . Social connections:    Talks on phone: More than three times a week    Gets together: More than three times a week    Attends religious service: Not on file    Active member of club or organization: Yes    Attends meetings of clubs or organizations: 1 to 4 times per year    Relationship status: Never married  Other Topics Concern  . Not on file  Social History Narrative  . Not on file    Vitals:   08/06/18 0708  BP: 110/70  Pulse: 75  Resp: 12  Temp: 98.3 F (36.8 C)  SpO2: 98%   Body mass index is 23.98 kg/m.   Physical Exam  Nursing note and vitals reviewed. Constitutional: She is oriented to person, place, and time. She appears well-developed and well-nourished. No distress.  HENT:  Head: Normocephalic and atraumatic.  Nose: Rhinorrhea and septal deviation present. Right sinus exhibits maxillary sinus tenderness ("little"). Right sinus exhibits no frontal sinus tenderness. Left sinus exhibits no maxillary sinus tenderness and no frontal sinus tenderness.  Mouth/Throat: Oropharynx is clear and moist and mucous membranes are normal.  Normal sinus transillumination.  Eyes: Pupils are equal, round, and reactive to light. Conjunctivae and EOM are normal.  Fundoscopic exam:      The  right eye shows no hemorrhage and no papilledema.       The left eye shows no hemorrhage and no papilledema.  Neck: Neck supple. No edema and no erythema present. No Brudzinski's sign and no Kernig's sign noted.  Cardiovascular: Normal rate and regular rhythm.  No murmur heard. Pulses:      Dorsalis pedis pulses are 2+ on the right side and 2+ on the left side.  Respiratory: Effort normal and breath sounds normal. No respiratory distress.  GI: Soft. She exhibits no mass. There is no hepatomegaly. There is no abdominal tenderness.  Musculoskeletal:        General: No edema.     Cervical back: She exhibits decreased range of motion (Mildly:Flexion and rotation), tenderness and spasm. She exhibits no edema and no deformity.       Back:  Lymphadenopathy:    She has no cervical adenopathy.  Neurological: She is alert and oriented to person, place, and time. She has normal strength. No cranial  nerve deficit. Gait normal.  Reflex Scores:      Bicep reflexes are 2+ on the right side and 2+ on the left side.      Patellar reflexes are 2+ on the right side and 2+ on the left side. Skin: Skin is warm. No rash noted. No erythema.  Psychiatric: Her mood appears anxious.  Well groomed, good eye contact.      ASSESSMENT AND PLAN:  Melinda Gray was seen today for headache, neck pain and nasal congestion.  Diagnoses and all orders for this visit:  Headache, unspecified headache type She was reassured, symptoms and examination today do not suggest meningitis. ?  Migraine vs tension headache. Here in the office and after verbal consent she received a Toradol 60 mg IM. I do not think head imaging is needed at this time. In 8 hours she can start with diclofenac 75 mg and continue twice daily for up to 5 days. Continue Zofran 4 mg 3 times daily as needed for nausea. Clearly instructed about warning signs.   -     Diclofenac (VOLTAREN) 75 MG EC tablet; Take 1 tablet (75 mg total) by mouth 2 (two)  times daily for 5 days. -     ketorolac (TORADOL) injection 60 mg  Allergic rhinitis, unspecified seasonality, unspecified trigger Problem could be aggravating respiratory symptoms. Short course of prednisone may help. We discussed some side effects of prednisone, she has taken it before and has tolerated it well. Nasal saline irrigations several times per day. Continue Flonase nasal spray.  -     predniSONE (DELTASONE) 20 MG tablet; Take 2 tablets (40 mg total) by mouth daily with breakfast for 3 days.  URI, acute Explained that it is most likely viral, antibiotics will not help. I did send antibiotic prescription to the pharmacy, recommend start taking it if she is not feeling any better in 3 to 4 days. Adequate hydration and rest.  Cervicalgia Chronic problem. In 8 hours she can start Diclofenac bid as needed. Instructed taking med with food and not with Diclofenac. Side effects of meds discussed. Local ice,massage,and ROM exercises may also help.       I do not think imaging is needed at this time.  -     Diclofenac (VOLTAREN) 75 MG EC tablet; Take 1 tablet (75 mg total) by mouth 2 (two) times daily for 5 days. -     methocarbamol (ROBAXIN) 500 MG tablet; Take 1 tablet (500 mg total) by mouth every 8 (eight) hours as needed for up to 15 days for muscle spasms. -     ketorolac (TORADOL) injection 60 mg  Other orders -     doxycycline (VIBRA-TABS) 100 MG tablet; Take 1 tablet (100 mg total) by mouth 2 (two) times daily for 7 days.     Return if symptoms worsen or fail to improve.     Kiira Brach G. [place], MD  [facility]. [place] office.

## 2018-08-11 ENCOUNTER — Ambulatory Visit: Payer: 59 | Admitting: Neurology

## 2018-08-12 ENCOUNTER — Telehealth: Payer: Self-pay | Admitting: Allergy & Immunology

## 2018-08-12 NOTE — Telephone Encounter (Signed)
Paperwork has been completed and patient is aware to come pick up. I am sending a copy to the scan center for her medical record.

## 2018-08-13 DIAGNOSIS — D803 Selective deficiency of immunoglobulin G [IgG] subclasses: Secondary | ICD-10-CM | POA: Diagnosis not present

## 2018-08-24 ENCOUNTER — Telehealth: Payer: Self-pay | Admitting: *Deleted

## 2018-08-25 ENCOUNTER — Encounter: Payer: Self-pay | Admitting: *Deleted

## 2018-08-25 ENCOUNTER — Ambulatory Visit: Payer: 59 | Admitting: Family Medicine

## 2018-08-25 ENCOUNTER — Encounter: Payer: Self-pay | Admitting: Family Medicine

## 2018-08-25 VITALS — BP 112/60 | HR 85 | Temp 98.4°F | Resp 12 | Ht 65.0 in | Wt 143.1 lb

## 2018-08-25 DIAGNOSIS — J069 Acute upper respiratory infection, unspecified: Secondary | ICD-10-CM | POA: Diagnosis not present

## 2018-08-25 DIAGNOSIS — J309 Allergic rhinitis, unspecified: Secondary | ICD-10-CM | POA: Diagnosis not present

## 2018-08-25 MED ORDER — PREDNISONE 20 MG PO TABS: 40.0000 mg | ORAL_TABLET | Freq: Every day | ORAL | 0 refills | Status: AC

## 2018-08-25 MED ORDER — DOXYCYCLINE HYCLATE 100 MG PO TABS: 100.0000 mg | ORAL_TABLET | Freq: Two times a day (BID) | ORAL | 0 refills | Status: AC

## 2018-08-25 NOTE — Telephone Encounter (Signed)
Error

## 2018-08-25 NOTE — Progress Notes (Signed)
ACUTE VISIT  HPI:  Chief Complaint  Patient presents with  . Sinusitis  . Nasal Congestion    green discharge    Melinda Gray is a 23 y.o.female here today complaining of 10 days of respiratory symptoms. She is concerned about possible sinus infection. She was seen on 08/06/2018 due to upper respiratory symptoms.  She did not fill prescription for antibiotics because symptoms resolved.  About 10 days ago she started with what she calls a cold, this past weekend she started feeling better but symptoms have not resolved.  Last week she had fever, 99 F Frontal pressure headache, mild. Nasal congestion, rhinorrhea, and postnasal drainage. "White-greenish" nasal drainage. Postnasal drainage causes cough, denies hemoptysis.   Sinus Problem  This is a new problem. The current episode started 1 to 4 weeks ago. The problem has been gradually improving since onset. The pain is mild. Associated symptoms include congestion, coughing, headaches and sinus pressure. Pertinent negatives include no ear pain, hoarse voice, neck pain, shortness of breath, sneezing, sore throat or swollen glands. Past treatments include oral decongestants. The treatment provided mild relief.   No Hx of recent travel. Classmates have been sick. No known insect bite.  Hx of allergies: She does not think so, states that her immunologist has done allergy test and it has been negative.  OTC medications for this problem: Mucinex.  Review of Systems  Constitutional: Positive for appetite change, fatigue and fever. Negative for activity change.  HENT: Positive for congestion, postnasal drip, rhinorrhea and sinus pressure. Negative for ear pain, facial swelling, hoarse voice, mouth sores, sneezing, sore throat and trouble swallowing.   Eyes: Negative for discharge, redness and itching.  Respiratory: Positive for cough. Negative for shortness of breath and wheezing.   Cardiovascular: Negative.     Gastrointestinal: Negative for abdominal pain, diarrhea, nausea and vomiting.  Musculoskeletal: Negative for myalgias and neck pain.  Skin: Negative for rash.  Allergic/Immunologic: Negative for environmental allergies.  Neurological: Positive for headaches. Negative for syncope and weakness.  Hematological: Negative for adenopathy. Does not bruise/bleed easily.  Psychiatric/Behavioral: The patient is nervous/anxious.       Current Outpatient Medications on File Prior to Visit  Medication Sig Dispense Refill  . albuterol (PROVENTIL HFA;VENTOLIN HFA) 108 (90 Base) MCG/ACT inhaler Inhale 2 puffs into the lungs every 4 (four) hours as needed for wheezing or shortness of breath. 18 g 2  . EPINEPHrine 0.3 mg/0.3 mL IJ SOAJ injection     . fluticasone (FLONASE) 50 MCG/ACT nasal spray Place 2 sprays into both nostrils daily as needed for allergies or rhinitis. 16 g 2  . Immune Globulin, Human, (HIZENTRA [place]) Inject 16 mg into the skin.    Marland Kitchen NUVARING 0.12-0.015 MG/24HR vaginal ring Place 1 each vaginally every 28 (twenty-eight) days.   11  . SUMAtriptan (IMITREX) 25 MG tablet Take 1 tablet (25 mg total) by mouth every 12 (twelve) hours as needed for migraine or headache. May repeat in 2 hours if headache persists or recurs. 10 tablet 0   No current facility-administered medications on file prior to visit.      Past Medical History:  Diagnosis Date  . Allergy   . Asthma   . Chronic bronchitis (HCC)   . Depression   . Frequent headaches   . GERD (gastroesophageal reflux disease)   . Primary immune deficiency disorder (HCC)   . Recurrent upper respiratory infection (URI)   . Urticaria    Allergies  Allergen Reactions  . Amoxicillin Nausea And Vomiting and Rash  . Amoxicillin-Pot Clavulanate Nausea And Vomiting and Rash    Other reaction(s): Dizziness    Social History   Socioeconomic History  . Marital status: Single    Spouse name: Not on file  . Number of children: Not on file   . Years of education: Not on file  . Highest education level: Not on file  Occupational History  . Not on file  Social Needs  . Financial resource strain: Not hard at all  . Food insecurity:    Worry: Never true    Inability: Never true  . Transportation needs:    Medical: No    Non-medical: No  Tobacco Use  . Smoking status: Never Smoker  . Smokeless tobacco: Never Used  Substance and Sexual Activity  . Alcohol use: Yes    Frequency: Never    Comment: occasional  . Drug use: Never  . Sexual activity: Yes    Partners: Male    Birth control/protection: Inserts  Lifestyle  . Physical activity:    Days per week: Not on file    Minutes per session: Not on file  . Stress: Only a little  Relationships  . Social connections:    Talks on phone: More than three times a week    Gets together: More than three times a week    Attends religious service: Not on file    Active member of club or organization: Yes    Attends meetings of clubs or organizations: 1 to 4 times per year    Relationship status: Never married  Other Topics Concern  . Not on file  Social History Narrative  . Not on file    Vitals:   08/25/18 1056  BP: 112/60  Pulse: 85  Resp: 12  Temp: 98.4 F (36.9 C)  SpO2: 97%   Body mass index is 23.82 kg/m.   Physical Exam  Nursing note and vitals reviewed. Constitutional: She is oriented to person, place, and time. She appears well-developed and well-nourished. She does not appear ill. No distress.  HENT:  Head: Normocephalic and atraumatic.  Right Ear: Tympanic membrane, external ear and ear canal normal.  Left Ear: Tympanic membrane, external ear and ear canal normal.  Nose: Rhinorrhea present. Right sinus exhibits no maxillary sinus tenderness and no frontal sinus tenderness. Left sinus exhibits no maxillary sinus tenderness and no frontal sinus tenderness.  Mouth/Throat: Oropharynx is clear and moist and mucous membranes are normal.  "Pressure"  feeling upon palpation of sinuses. Postnasal drainage. Hypertrophic turbinates.  Eyes: Pupils are equal, round, and reactive to light. Conjunctivae are normal.  Neck: No muscular tenderness present. No edema and no erythema present.  Cardiovascular: Normal rate and regular rhythm.  No murmur heard. Respiratory: Effort normal and breath sounds normal. No stridor. No respiratory distress.  Lymphadenopathy:       Head (right side): No submandibular adenopathy present.       Head (left side): No submandibular adenopathy present.    She has no cervical adenopathy.  Neurological: She is alert and oriented to person, place, and time. She has normal strength. No cranial nerve deficit. Gait normal.  Skin: Skin is warm. No rash noted. No erythema.  Psychiatric: Her mood appears anxious.  Well groomed, good eye contact.    ASSESSMENT AND PLAN:   Melinda Gray was seen today for sinusitis and nasal congestion.  Diagnoses and all orders for this visit:  Upper respiratory tract infection,  unspecified type Explained that most likely these are residual symptoms after a viral URI. I do not think antibiotic treatment is necessary at this time, she is concerned about a serious infection due to history of systemic inflammatory response syndrome and past history of aseptic meningitis. Recommend continuing Mucinex. She was instructed to start antibiotic treatment if she is not any better in 3 to 4 days.  We discussed possible side effects of antibiotic treatment, including GI symptoms and the risk of developing resistance. Instructed about warning signs. Follow-up as needed.  -     doxycycline (VIBRA-TABS) 100 MG tablet; Take 1 tablet (100 mg total) by mouth 2 (two) times daily for 7 days.  Allergic rhinitis, unspecified seasonality, unspecified trigger Problem can aggravate above symptoms. After discussion of some side effects, she agrees with taking prednisone 40 mg x 3 days.   Last visit I  recommended prednisone but she did not need to use it.  She has tolerated prednisone well in the past. Nasal irrigation with saline several times per day. Continue Flonase nasal spray daily as needed. I also recommend taking a daily OTC antihistaminic, Allegra 180 mg or Zyrtec 10 mg daily.   -     predniSONE (DELTASONE) 20 MG tablet; Take 2 tablets (40 mg total) by mouth daily with breakfast for 3 days.    Return if symptoms worsen or fail to improve.      Abbegale Stehle G. [place], MD  [facility]. [place] office.

## 2018-08-25 NOTE — Patient Instructions (Addendum)
A few things to remember from today's visit:   Upper respiratory tract infection, unspecified type - Plan: doxycycline (VIBRA-TABS) 100 MG tablet  Allergic rhinitis, unspecified seasonality, unspecified trigger - Plan: predniSONE (DELTASONE) 20 MG tablet  I to not think you need to take antibiotic at this time. I sent doxycycline to the pharmacy, you can start medication if you do not feel any better in 4 days or if you develop temperature of 100 F or more. Take prednisone with food. Saline nasal irrigations will also help. Start a over-the-counter antihistaminic, Allegra 180 mg or Zyrtec 10 mg.   Please be sure medication list is accurate. If a new problem present, please set up appointment sooner than planned today.

## 2018-08-27 DIAGNOSIS — Z6824 Body mass index (BMI) 24.0-24.9, adult: Secondary | ICD-10-CM | POA: Diagnosis not present

## 2018-08-27 DIAGNOSIS — Z01419 Encounter for gynecological examination (general) (routine) without abnormal findings: Secondary | ICD-10-CM | POA: Diagnosis not present

## 2018-08-27 DIAGNOSIS — Z1159 Encounter for screening for other viral diseases: Secondary | ICD-10-CM | POA: Diagnosis not present

## 2018-08-27 DIAGNOSIS — Z118 Encounter for screening for other infectious and parasitic diseases: Secondary | ICD-10-CM | POA: Diagnosis not present

## 2018-09-21 ENCOUNTER — Ambulatory Visit: Payer: 59 | Admitting: Allergy & Immunology

## 2018-09-24 ENCOUNTER — Ambulatory Visit: Payer: Self-pay

## 2018-09-24 NOTE — Telephone Encounter (Signed)
Noted  

## 2018-09-24 NOTE — Telephone Encounter (Signed)
Pt called stating that she was showing signs of COVID-19.  She has been to [place] last week and returned 2 days ago not feeling well. She has SOB cough stuffy nose headache and fever.  Last temp 99-100. As we talked she has no known exposure to any person positive or exposed to COVID-19.  She has Hx of asthma and Primary Immune deficiency. She has SQ infusions of imunoglobulin. Pt was reassured that her risk is extremely low for exposure. Pt verbalized understanding.  Call placed to Sheena at office who was unable to schedule appointment today. Appointment scheduled per protocol for cough fever symptoms Saturday clinic. Pt was encouraged to practice good hand hygiene cover a cough and and avoid large crowds. Care advice given. Pt verbalized understanding.  Reason for Disposition . [1] Continuous (nonstop) coughing AND [2] keeps from working or sleeping  Answer Assessment - Initial Assessment Questions 1. PLACE of CONTACT: "Where were you when you were exposed to coronavirus?" (e.g., city, state, country)     [place], [place] 2. TYPE of CONTACT: "How much contact was there?" (e.g., live in same house, work in same office, same school)     unknown 3. DATE of CONTACT: "When did you have contact with a coronavirus patient?" (e.g., days)     No contact 4. SYMPTOMS: "Do you have any symptoms?" (e.g., fever, cough, breathing difficulty)    Cough fever SOB 5. PREGNANCY OR POSTPARTUM: "Is there any chance you are pregnant?" "When was your last menstrual period?" "Did you deliver in the last 2 weeks?"     Not pregnant 6. HIGH RISK: "Do you have any heart or lung problems? Do you have a weakened immune system?" (e.g., CHF, COPD, asthma, HIV positive, chemotherapy, renal failure, diabetes mellitus, sickle cell anemia)   Primary immune disorder  Answer Assessment - Initial Assessment Questions 1. RESPIRATORY STATUS: "Describe your breathing?" (e.g., wheezing, shortness of breath, unable to speak, severe  coughing)      Feel like cant get deep breath 2. ONSET: "When did this breathing problem begin?"      2 days ago 3. PATTERN "Does the difficult breathing come and go, or has it been constant since it started?"      no 4. SEVERITY: "How bad is your breathing?" (e.g., mild, moderate, severe)    - MILD: No SOB at rest, mild SOB with walking, speaks normally in sentences, can lay down, no retractions, pulse < 100.    - MODERATE: SOB at rest, SOB with minimal exertion and prefers to sit, cannot lie down flat, speaks in phrases, mild retractions, audible wheezing, pulse 100-120.    - SEVERE: Very SOB at rest, speaks in single words, struggling to breathe, sitting hunched forward, retractions, pulse > 120      Yes  5. RECURRENT SYMPTOM: "Have you had difficulty breathing before?" If so, ask: "When was the last time?" and "What happened that time?"      No 6. CARDIAC HISTORY: "Do you have any history of heart disease?" (e.g., heart attack, angina, bypass surgery, angioplasty)      no 7. LUNG HISTORY: "Do you have any history of lung disease?"  (e.g., pulmonary embolus, asthma, emphysema)     asthma 8. CAUSE: "What do you think is causing the breathing problem?"      COVID-19 9. OTHER SYMPTOMS: "Do you have any other symptoms? (e.g., dizziness, runny nose, cough, chest pain, fever)    Severe cough, Fever, headache stuffy nose fatigue  Primary immune defiency 10. PREGNANCY: "Is there any chance you are pregnant?" "When was your last menstrual period?"       No 11. TRAVEL: "Have you traveled out of the country in the last month?" (e.g., travel history, exposures)     Traveled to [place] via auto  Protocols used: BREATHING DIFFICULTY-A-AH, CORONAVIRUS (2019-NCOV) EXPOSURE-A-AH

## 2018-09-25 ENCOUNTER — Other Ambulatory Visit: Payer: Self-pay | Admitting: Family Medicine

## 2018-09-25 ENCOUNTER — Ambulatory Visit: Payer: 59 | Admitting: Family Medicine

## 2018-09-25 ENCOUNTER — Other Ambulatory Visit: Payer: Self-pay

## 2018-09-25 ENCOUNTER — Encounter: Payer: Self-pay | Admitting: Family Medicine

## 2018-09-25 VITALS — BP 122/74 | HR 84 | Temp 98.7°F

## 2018-09-25 DIAGNOSIS — R0602 Shortness of breath: Secondary | ICD-10-CM | POA: Diagnosis not present

## 2018-09-25 DIAGNOSIS — B9789 Other viral agents as the cause of diseases classified elsewhere: Secondary | ICD-10-CM | POA: Diagnosis not present

## 2018-09-25 DIAGNOSIS — J069 Acute upper respiratory infection, unspecified: Secondary | ICD-10-CM | POA: Diagnosis not present

## 2018-09-25 DIAGNOSIS — D849 Immunodeficiency, unspecified: Secondary | ICD-10-CM | POA: Diagnosis not present

## 2018-09-25 DIAGNOSIS — D8489 Other immunodeficiencies: Secondary | ICD-10-CM

## 2018-09-25 LAB — POC INFLUENZA A&B (BINAX/QUICKVUE)
Influenza A, POC: NEGATIVE
Influenza B, POC: NEGATIVE

## 2018-09-25 MED ORDER — DOXYCYCLINE HYCLATE 100 MG PO TABS: 100.0000 mg | ORAL_TABLET | Freq: Two times a day (BID) | ORAL | 0 refills | Status: DC

## 2018-09-25 MED ORDER — ALBUTEROL SULFATE (2.5 MG/3ML) 0.083% IN NEBU: 2.5000 mg | INHALATION_SOLUTION | Freq: Four times a day (QID) | RESPIRATORY_TRACT | 1 refills | Status: AC | PRN

## 2018-09-25 MED ORDER — BENZONATATE 200 MG PO CAPS: 200.0000 mg | ORAL_CAPSULE | Freq: Three times a day (TID) | ORAL | 1 refills | Status: DC | PRN

## 2018-09-25 NOTE — Progress Notes (Signed)
Subjective:    Patient ID: Melinda Gray, female    DOB: [birth year], 23 y.o.   MRN: [id number]  HPI 23 yo [name] here with upper respiratory symptoms   She has been traveling  [place] last week and returned 2 days ago (drove but stopped in quite a few places including [place]   Temp at home 99-100  (body aches/fatigue)  No known exp to covid 56 Mother has been around her and came with her today  Now ST and nasal congestion Clear nasal d/c    Hx of asthma and primary immune def Has had SQ infusions of immunoglobulin - every 2 weeks (16 mg)  She requests nebulizer machine for home with albuterol Does not wheeze but does cough and feel tight  Chest is sore from cough/ feels like it is burning   Dry cough -not productive Temp 98.7 today    Results for orders placed or performed in visit on 08/03/18  POC Influenza A&B(BINAX/QUICKVUE)  Result Value Ref Range   Influenza A, POC Negative Negative   Influenza B, POC Negative Negative  POC Rapid Strep A  Result Value Ref Range   Rapid Strep A Screen Negative Negative    Patient Active Problem List   Diagnosis Date Noted  . Viral URI with cough 09/25/2018  . Shortness of breath 09/25/2018  . Migraine headache without aura 05/18/2018  . Allergic rhinitis 05/18/2018  . GERD (gastroesophageal reflux disease) 05/18/2018  . Aseptic meningitis 05/07/2018  . Primary immune deficiency disorder (HCC) 05/06/2018  . SIRS (systemic inflammatory response syndrome) (HCC) 05/05/2018   Past Medical History:  Diagnosis Date  . Allergy   . Asthma   . Chronic bronchitis (HCC)   . Depression   . Frequent headaches   . GERD (gastroesophageal reflux disease)   . Primary immune deficiency disorder (HCC)   . Recurrent upper respiratory infection (URI)   . Urticaria    Past Surgical History:  Procedure Laterality Date  . FOOT SURGERY    . HIP SURGERY     Social History   Tobacco Use  . Smoking status: Never  Smoker  . Smokeless tobacco: Never Used  Substance Use Topics  . Alcohol use: Yes    Frequency: Never    Comment: occasional  . Drug use: Never   Family History  Problem Relation Age of Onset  . Alport syndrome Father   . Hearing loss Father   . Hyperlipidemia Father   . Hypertension Father   . Kidney disease Father   . Allergic rhinitis Father   . Urticaria Father   . Diabetes Mellitus II Maternal Grandmother   . Arthritis Maternal Grandmother   . COPD Maternal Grandmother   . Depression Maternal Grandmother   . Cancer Maternal Grandmother   . Hearing loss Maternal Grandmother   . Allergic rhinitis Maternal Grandmother   . Urticaria Maternal Grandmother   . Miscarriages / [place] Mother   . Allergic rhinitis Mother   . Urticaria Mother   . Depression Maternal Grandfather   . COPD Maternal Grandfather   . Allergic rhinitis Maternal Grandfather   . Urticaria Maternal Grandfather   . Arthritis Paternal Grandmother   . Hyperlipidemia Paternal Grandmother   . Hypertension Paternal Grandmother   . Stroke Paternal Grandmother   . Allergic rhinitis Paternal Grandmother   . Urticaria Paternal Grandmother   . Allergic rhinitis Paternal Grandfather   . Urticaria Paternal Grandfather   . Eczema Neg Hx   .  Asthma Neg Hx    Allergies  Allergen Reactions  . Amoxicillin Nausea And Vomiting and Rash  . Amoxicillin-Pot Clavulanate Nausea And Vomiting and Rash    Other reaction(s): Dizziness   Current Outpatient Medications on File Prior to Visit  Medication Sig Dispense Refill  . albuterol (PROVENTIL HFA;VENTOLIN HFA) 108 (90 Base) MCG/ACT inhaler Inhale 2 puffs into the lungs every 4 (four) hours as needed for wheezing or shortness of breath. 18 g 2  . EPINEPHrine 0.3 mg/0.3 mL IJ SOAJ injection     . fluticasone (FLONASE) 50 MCG/ACT nasal spray Place 2 sprays into both nostrils daily as needed for allergies or rhinitis. 16 g 2  . Immune Globulin, Human, (HIZENTRA [place]) Inject  16 mg into the skin.    Marland Kitchen NUVARING 0.12-0.015 MG/24HR vaginal ring Place 1 each vaginally every 28 (twenty-eight) days.   11  . SUMAtriptan (IMITREX) 25 MG tablet Take 1 tablet (25 mg total) by mouth every 12 (twelve) hours as needed for migraine or headache. May repeat in 2 hours if headache persists or recurs. 10 tablet 0   No current facility-administered medications on file prior to visit.      Review of Systems  Constitutional: Positive for appetite change, chills, fatigue, fever and unexpected weight change.  HENT: Positive for congestion, postnasal drip, rhinorrhea, sinus pressure, sneezing and sore throat. Negative for ear pain and voice change.   Eyes: Negative for pain and discharge.  Respiratory: Positive for cough and chest tightness. Negative for shortness of breath, wheezing and stridor.   Cardiovascular: Negative for chest pain.       Chest wall soreness with cough   Gastrointestinal: Negative for abdominal distention, abdominal pain, diarrhea, nausea and vomiting.  Genitourinary: Negative for frequency, hematuria and urgency.  Musculoskeletal: Negative for arthralgias and myalgias.       Body aches  Skin: Negative for rash.  Neurological: Positive for headaches. Negative for dizziness, weakness and light-headedness.  Psychiatric/Behavioral: Negative for confusion and dysphoric mood.       Objective:   Physical Exam Constitutional:      General: She is not in acute distress.    Appearance: Normal appearance. She is normal weight. She is not ill-appearing, toxic-appearing or diaphoretic.  HENT:     Head: Normocephalic and atraumatic.     Right Ear: Tympanic membrane and ear canal normal.     Left Ear: Tympanic membrane and ear canal normal.     Nose: Congestion and rhinorrhea present.     Mouth/Throat:     Mouth: Mucous membranes are moist.     Pharynx: Oropharynx is clear. No oropharyngeal exudate or posterior oropharyngeal erythema.     Comments: Clear pnd Eyes:      General:        Right eye: No discharge.        Left eye: No discharge.     Extraocular Movements: Extraocular movements intact.     Conjunctiva/sclera: Conjunctivae normal.     Pupils: Pupils are equal, round, and reactive to light.  Cardiovascular:     Rate and Rhythm: Normal rate and regular rhythm.     Heart sounds: Normal heart sounds.  Pulmonary:     Effort: Pulmonary effort is normal. No respiratory distress.     Breath sounds: Normal breath sounds. No stridor. No wheezing, rhonchi or rales.     Comments: Harsh bs No wheeze even on forced expiration  No prolonged exp time  No rales or rhonchi  Harsh/dry cough  Lymphadenopathy:     Cervical: No cervical adenopathy.  Skin:    General: Skin is warm and dry.     Capillary Refill: Capillary refill takes less than 2 seconds.     Coloration: Skin is not pale.     Findings: No erythema or rash.  Neurological:     Mental Status: She is alert.     Cranial Nerves: No cranial nerve deficit.     Coordination: Coordination normal.  Psychiatric:        Mood and Affect: Mood normal.           Assessment & Plan:   Problem List Items Addressed This Visit      Respiratory   Viral URI with cough - Primary    With fever and recent in country travel in car -in pt with immune deficiency  Subjective sob - good pulse ox and reassuring exam  Neg strep and flu tests covid-19 test pending (discussed quarantine and paperwork done and faxed to health dept) In light of hx of imm def Doxycycline sent Tessalon for cough  Albuterol soln and NMT machine ps  Work notes for she and mother for 5 d-will extend to 14 if covid testing is positive inst to update if symptoms worsen/call and seek care in ER if needed        Relevant Orders   POC Influenza A&B(BINAX/QUICKVUE) (Completed)   Coronavirus CoVID-19 ([facility])     Other   Primary immune deficiency disorder (HCC)    Pt has not had her immunoglobulin injection in 1 month  Here with  viral symptoms  Flu /strep tests are negative Covid -19 testing done and pending Will watch closely for worsening symptoms       Shortness of breath    In the setting of viral uri (neg flu test)  covid-19 result pending Pt has atypical asthma and immune def disorder Reassuring exam  Albuterol solution with neb machine px  Also doxycycline and tessalon  If sob worsens - inst to update and call/get to ED if necessary       Relevant Orders   DME Other see comment   POC Influenza A&B(BINAX/QUICKVUE) (Completed)   Coronavirus CoVID-19 ([facility])

## 2018-09-25 NOTE — Assessment & Plan Note (Signed)
Pt has not had her immunoglobulin injection in 1 month  Here with viral symptoms  Flu /strep tests are negative Covid -19 testing done and pending Will watch closely for worsening symptoms

## 2018-09-25 NOTE — Assessment & Plan Note (Signed)
With fever and recent in country travel in car -in pt with immune deficiency  Subjective sob - good pulse ox and reassuring exam  Neg strep and flu tests covid-19 test pending (discussed quarantine and paperwork done and faxed to health dept) In light of hx of imm def Doxycycline sent Tessalon for cough  Albuterol soln and NMT machine ps  Work notes for she and mother for 5 d-will extend to 14 if covid testing is positive inst to update if symptoms worsen/call and seek care in ER if needed

## 2018-09-25 NOTE — Patient Instructions (Signed)
Drink fluids and rest  Isolate yourself  Covid test takes 4-5 days If symptoms worsen call / call 911 if needed/get to the hospital  Take doxycycline as directed Tessalon for cough Delsym over the counter for cough   Albuterol with nebulizer  Hope you feel better soon

## 2018-09-25 NOTE — Assessment & Plan Note (Signed)
In the setting of viral uri (neg flu test)  covid-19 result pending Pt has atypical asthma and immune def disorder Reassuring exam  Albuterol solution with neb machine px  Also doxycycline and tessalon  If sob worsens - inst to update and call/get to ED if necessary

## 2018-09-27 LAB — TIQ-MISC

## 2018-09-28 ENCOUNTER — Telehealth: Payer: Self-pay

## 2018-09-28 NOTE — Telephone Encounter (Signed)
Copied from CRM [id number]. Topic: General - Other >> Sep 28, 2018  9:00 AM Jaquita Rector A wrote: Reason for CRM: Patient called to request clarification on message in my chart. She is asking for a call back to get clarification and results of her test done on 09/25/2018. Please advise Ph# [phone] >> Sep 28, 2018  9:52 AM Marquis Buggy A wrote: Wrong office, sending to right office.  >> Sep 28, 2018 10:00 AM Morphies, Hermine Messick wrote: Patient was seen at the [facility] by Dr Milinda Antis. Routing to [facility].

## 2018-09-28 NOTE — Telephone Encounter (Signed)
Pt is aware that it will likely take 5 days for results, she will be contacted when results have been resulted, if work note needs to be extended due to delay of results, this can be done

## 2018-09-30 LAB — SARS-COV-2 RNA, QUALITATIVE REAL-TIME RT-PCR: SARS CoV 2 RNA, RT PCR: NOT DETECTED

## 2018-10-05 NOTE — Progress Notes (Unsigned)
Per Dr. Karel Jarvis, pls reschedule pt's 10/15/2018 visit with her to Dr. Everlena Cooper.  Thanks!

## 2018-10-15 ENCOUNTER — Ambulatory Visit: Payer: 59 | Admitting: Neurology

## 2018-12-23 ENCOUNTER — Encounter: Payer: Self-pay | Admitting: Neurology

## 2018-12-28 ENCOUNTER — Ambulatory Visit: Payer: 59 | Admitting: Neurology

## 2019-02-07 ENCOUNTER — Telehealth: Payer: Self-pay

## 2019-02-07 NOTE — Telephone Encounter (Signed)
Patient called with complaints of 10+ asthma attacks over the past 5 days. She informed me that she is using both her albuterol inhaler and albuterol nebulizer solution. She says that he rescue meds are not helping. She develops sharp/stabbing pain in her lungs and begins having shortness of breath. She does not have any wheezing. She does start coughing once she uses her nebulizer machine. Patient denies any other symptoms. I did offer her an appointment for 02/07/2019 and she told me that she would not be able to come in today because was at work until later [employer]. She has been scheduled for tomorrow evening with Dr. Ernst Bowler.

## 2019-02-08 ENCOUNTER — Other Ambulatory Visit: Payer: Self-pay

## 2019-02-08 ENCOUNTER — Encounter: Payer: Self-pay | Admitting: Allergy & Immunology

## 2019-02-08 ENCOUNTER — Ambulatory Visit (INDEPENDENT_AMBULATORY_CARE_PROVIDER_SITE_OTHER): Payer: 59 | Admitting: Allergy & Immunology

## 2019-02-08 VITALS — BP 96/62 | HR 94 | Temp 98.0°F | Resp 16 | Ht 63.0 in | Wt 147.8 lb

## 2019-02-08 DIAGNOSIS — D803 Selective deficiency of immunoglobulin G [IgG] subclasses: Secondary | ICD-10-CM | POA: Diagnosis not present

## 2019-02-08 DIAGNOSIS — J310 Chronic rhinitis: Secondary | ICD-10-CM

## 2019-02-08 DIAGNOSIS — J4520 Mild intermittent asthma, uncomplicated: Secondary | ICD-10-CM

## 2019-02-08 NOTE — Progress Notes (Signed)
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 IgG subclass deficiency - Continue with Cuvitru every other week for now at the same schedule (EVERY TWO WEEKS).  - We could consider changing to Hyqvia (once monthly) if you are interested, but let's make that change AFTER your surgery.   2. Chronic rhinitis - Continue with the nasal saline gel.  - Continue with Xyzal daily.   - We will get you in for skin testing AFTER your surgery to definitively diagnose your allergies.   3. Mild persistent asthma, uncomplicated - Lung testing looks good today. - We are going to start a low dose prednisone pack to get you back under control. - We are also going to start Qvar 32mg two puffs twice daily.  - Hopefully this will help keep you under better control.. - Daily controller medication(s): Qvar 833m Redihaler 2 puffs twice daily - Prior to physical activity: albuterol 2 puffs 10-15 minutes before physical activity. - Rescue medications: albuterol 4 puffs every 4-6 hours as needed - Changes during respiratory infections or worsening symptoms: Increase Qvar 807mto 4 puffs twice daily for ONE TO TWO WEEKS. - Asthma control goals:  * Full participation in all desired activities (may need albuterol before activity) * Albuterol use two time or less a week on average (not counting use with activity) * Cough interfering with sleep two time or less a month * Oral steroids no more than once a year * No hospitalizations  4. Return in about 3 months (around 05/11/2019). This can be an in-person, a virtual Webex or a telephone follow up visit.  Subjective:   Hedaya MarLavonya Hoerner a [age]o. female  presenting today for follow up of  Chief Complaint  Patient presents with  . Asthma    asthma has been giving her issues for the last 7 days, chest tightness, had 12 flares in the last 7 days.  . Allergic Rhinitis     Annabell MarWanza Szumskis a history of the following: Patient Active Problem List   Diagnosis Date Noted  . Viral URI with cough 09/25/2018  . Shortness of breath 09/25/2018  . Migraine headache without aura 05/18/2018  . Allergic rhinitis 05/18/2018  . GERD (gastroesophageal reflux disease) 05/18/2018  . Aseptic meningitis 05/07/2018  . Primary immune deficiency disorder ([facility]0/24/2019  . SIRS (systemic inflammatory response syndrome) ([facility]0/23/2019    History obtained from: chart review and patient.  CasAlonni a [age]o. female presenting for a follow up visit. She has a history of IgG subclass deficiency as well as intermittent asthma and NAR. She was finally approved for immunoglobulin replacement and it seems that she has done well since that time.   Since the last visit, she has mostly done well. She is only using her Cuvitru every month or so. She knows that she needs to use it every two weeks, but for whatever reason she forgets.  Despite this, she has not needed before we started her on immunoglobulin replacement.  She has had no infusion reactions whatsoever.  [facility] BriMadie RenoShe is having surgery on August 19th with Dr. 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  She has been referred for a genetic work-up, but the wait list is several years long.  Asthma/Respiratory Symptom History: She reports that around 7 days ago, she had "three attacks in a day". Then she had a few days of additional attakcs. She tells me that there is tightness in the chest at first  and then stabbing pains. The albuterol does not help at all. Episodes last from 15 minutes to 90 minutes. She has tried using nebulizer without improvement. This is how her asthma typically presents but normally the albuterol helps. She has never had them present this close together with sucession. She has not had an attack today but the tightness dissipated. She had a rescue inhaler and then a breathing treatment on top of that. Then she had another one. She denies a fever but tells me that her lungs are "seizing up" and her "bronchiole tubes" are swelling as well. Scents typically do not trigger her symptoms, but she knows that certain exercise, triggers, pollens (despite her negative testing. She has been on a number of controller medications, including Symbicort at least. She has been on Qvar in the past for one year; she did feel that this helped get it back under control.   Allergic Rhinitis Symptom History: Her last testing was done in 2010 and 2015. Testing was negative in 2015. She remains on the Xyzal 17m. She continues to feel that there is some kind of environmental allergen that is triggering her symptoms. She is open to repeat testing (she had negative sIgE testing when I first met her).   Otherwise, there have been no changes to her past medical history, surgical history, family history, or social history.  [facility]  She has 1 more year left of school, although she could have finished quicker.  However, given her surgery and now COVID, she prefers to spread it out a little more.    Review of Systems  Constitutional: Negative.  Negative for chills, fever, malaise/fatigue and weight loss.  HENT: Negative.  Negative for congestion, ear discharge, ear pain and sore throat.        Positive for constant rhinorrhea.   Eyes: Negative for pain, discharge and redness.  Respiratory: Negative for cough, sputum production, shortness of breath and wheezing.    Cardiovascular: Negative.  Negative for chest pain and palpitations.  Gastrointestinal: Negative for abdominal pain, constipation, diarrhea, heartburn, nausea and vomiting.  Skin: Negative.  Negative for itching and rash.  Neurological: Negative for dizziness and headaches.  Endo/Heme/Allergies: Negative for environmental allergies. Does not bruise/bleed easily.       Objective:   Blood pressure 96/62, pulse 94, temperature 98 F (36.7 C), temperature source Temporal, resp. rate 16, height '5\' 3"'  (1.6 m), weight 147 lb 12.8 oz (67 kg), SpO2 98 %. Body mass index is 26.18 kg/m.   Physical Exam:  Physical Exam  Constitutional: She appears well-developed.  HENT:  Head: Normocephalic and atraumatic.  Right Ear: Tympanic membrane, external ear and ear canal normal.  Left Ear: Tympanic membrane and ear canal normal.  Nose: No mucosal edema, rhinorrhea, nasal deformity or septal deviation. No epistaxis. Right sinus exhibits no maxillary sinus tenderness and no frontal sinus tenderness. Left sinus exhibits no maxillary sinus tenderness and no frontal sinus tenderness.  Mouth/Throat: Uvula is midline and oropharynx is clear  and moist. Mucous membranes are not pale and not dry.  Eyes: Pupils are equal, round, and reactive to light. Conjunctivae and EOM are normal. Right eye exhibits no chemosis and no discharge. Left eye exhibits no chemosis and no discharge. Right conjunctiva is not injected. Left conjunctiva is not injected.  Cardiovascular: Normal rate, regular rhythm and normal heart sounds.  Respiratory: Effort normal and breath sounds normal. No accessory muscle usage. No tachypnea. No respiratory distress. She has no wheezes. She has no rhonchi. She has no rales. She exhibits no tenderness.  Lymphadenopathy:    She has no cervical adenopathy.  Neurological: She is alert.  Skin: No abrasion, no petechiae and no rash noted. Rash is not papular, not vesicular and not urticarial. No  erythema. No pallor.  Psychiatric: She has a normal mood and affect.     Diagnostic studies:    Spirometry: results normal (FEV1: 3.40/107%, FVC: 4.10/112%, FEV1/FVC: 83%).    Spirometry consistent with normal pattern. The loop is somewhat flattened, but otherwise everything looks normal.   Allergy Studies: none     Salvatore Marvel, MD  [facility]

## 2019-02-08 NOTE — Patient Instructions (Addendum)
1. IgG subclass deficiency - Continue with Cuvitru every other week for now at the same schedule (EVERY TWO WEEKS).  - We could consider changing to Hyqvia (once monthly) if you are interested, but let's make that change AFTER your surgery.   2. Chronic rhinitis - Continue with the nasal saline gel.  - Continue with Xyzal daily.   - We will get you in for skin testing AFTER your surgery to definitively diagnose your allergies.   3. Mild persistent asthma, uncomplicated - Lung testing looks good today. - We are going to start a low dose prednisone pack to get you back under control. - We are also going to start Qvar 6mcg two puffs twice daily.  - Hopefully this will help keep you under better control.. - Daily controller medication(s): Qvar 66mcg Redihaler 2 puffs twice daily - Prior to physical activity: albuterol 2 puffs 10-15 minutes before physical activity. - Rescue medications: albuterol 4 puffs every 4-6 hours as needed - Changes during respiratory infections or worsening symptoms: Increase Qvar 37mcg to 4 puffs twice daily for ONE TO TWO WEEKS. - Asthma control goals:  * Full participation in all desired activities (may need albuterol before activity) * Albuterol use two time or less a week on average (not counting use with activity) * Cough interfering with sleep two time or less a month * Oral steroids no more than once a year * No hospitalizations  4. Return in about 3 months (around 05/11/2019). This can be an in-person, a virtual Webex or a telephone follow up visit.   Please inform us of any Emergency Department visits, hospitalizations, or changes in symptoms. Call us before going to the ED for breathing or allergy symptoms since we might be able to fit you in for a sick visit. Feel free to contact us anytime with any questions, problems, or concerns.  It was a pleasure to see you again today!  Websites that have reliable patient information: 1. American Academy of  Asthma, Allergy, and Immunology: www.aaaai.org 2. Food Allergy Research and Education (FARE): foodallergy.org 3. Mothers of Asthmatics: http://www.asthmacommunitynetwork.org 4. American College of Allergy, Asthma, and Immunology: www.acaai.org  "Like" [place] on Facebook and Instagram for our latest updates!      Make sure you are registered to vote! If you have moved or changed any of your contact information, you will need to get this updated before voting!  In some cases, you MAY be able to register to vote online: [email or web address]    Voter ID laws are NOT going into effect for the General Election in November 2020! DO NOT let this stop you from exercising your right to vote!   Absentee voting is the SAFEST way to vote during the coronavirus pandemic!   Download and print an absentee ballot request form at [email or web address] or you can scan the QR code below with your smart phone:      More information on absentee ballots can be found here: [email or web address]

## 2019-03-10 ENCOUNTER — Other Ambulatory Visit: Payer: Self-pay

## 2019-03-10 ENCOUNTER — Ambulatory Visit (INDEPENDENT_AMBULATORY_CARE_PROVIDER_SITE_OTHER): Payer: 59 | Admitting: Allergy & Immunology

## 2019-03-10 ENCOUNTER — Encounter: Payer: Self-pay | Admitting: Allergy & Immunology

## 2019-03-10 VITALS — BP 106/68 | HR 90 | Resp 18

## 2019-03-10 DIAGNOSIS — J3089 Other allergic rhinitis: Secondary | ICD-10-CM | POA: Diagnosis not present

## 2019-03-10 DIAGNOSIS — J302 Other seasonal allergic rhinitis: Secondary | ICD-10-CM

## 2019-03-10 DIAGNOSIS — J310 Chronic rhinitis: Secondary | ICD-10-CM

## 2019-03-10 DIAGNOSIS — J4530 Mild persistent asthma, uncomplicated: Secondary | ICD-10-CM | POA: Diagnosis not present

## 2019-03-10 DIAGNOSIS — D803 Selective deficiency of immunoglobulin G [IgG] subclasses: Secondary | ICD-10-CM

## 2019-03-10 NOTE — Patient Instructions (Addendum)
1. Chronic rhinitis - Testing today showed: grasses, ragweed, indoor molds, dust mites, cat and dog - Copy of test results provided. - Avoidance measures provided. - Continue with: Xyzal (levocetirizine) 5mg  tablet once daily - Start taking: Nasacort (triamcinolone) two sprays per nostril daily and Astelin (azelastine) 2 sprays per nostril 1-2 times daily as needed - You can use an extra dose of the antihistamine, if needed, for breakthrough symptoms.  - Consider nasal saline rinses 1-2 times daily to remove allergens from the nasal cavities as well as help with mucous clearance (this is especially helpful to do before the nasal sprays are given) - Consider allergy shots as a means of long-term control. - Allergy shots "re-train" and "reset" the immune system to ignore environmental allergens and decrease the resulting immune response to those allergens (sneezing, itchy watery eyes, runny nose, nasal congestion, etc).    - Allergy shots improve symptoms in 75-85% of patients.  - Make an appointment to start allergy shots in two weeks.   2. Return in about 3 months (around 06/10/2019). This can be an in-person, a virtual Webex or a telephone follow up visit.   Please inform us of any Emergency Department visits, hospitalizations, or changes in symptoms. Call us before going to the ED for breathing or allergy symptoms since we might be able to fit you in for a sick visit. Feel free to contact us anytime with any questions, problems, or concerns.  It was a pleasure to see you again today!  Websites that have reliable patient information: 1. American Academy of Asthma, Allergy, and Immunology: www.aaaai.org 2. Food Allergy Research and Education (FARE): foodallergy.org 3. Mothers of Asthmatics: http://www.asthmacommunitynetwork.org 4. American College of Allergy, Asthma, and Immunology: www.acaai.org  "Like" us on Facebook and Instagram for our latest updates!      Make sure you are  registered to vote! If you have moved or changed any of your contact information, you will need to get this updated before voting!  In some cases, you MAY be able to register to vote online: [email or web address]    Voter ID laws are NOT going into effect for the General Election in November 2020! DO NOT let this stop you from exercising your right to vote!   Absentee voting is the SAFEST way to vote during the coronavirus pandemic!   Download and print an absentee ballot request form at [email or web address] or you can scan the QR code below with your smart phone:      More information on absentee ballots can be found here: [email or web address]  Reducing Pollen Exposure  The American Academy of Allergy, Asthma and Immunology suggests the following steps to reduce your exposure to pollen during allergy seasons.    1. Do not hang sheets or clothing out to dry; pollen may collect on these items. 2. Do not mow lawns or spend time around freshly cut grass; mowing stirs up pollen. 3. Keep windows closed at night.  Keep car windows closed while driving. 4. Minimize morning activities outdoors, a time when pollen counts are usually at their highest. 5. Stay indoors as much as possible when pollen counts or humidity is high and on windy days when pollen tends to remain in the air longer. 6. Use air conditioning when possible.  Many air conditioners have filters that trap the pollen spores. 7. Use a HEPA room air filter to remove pollen form the indoor air you breathe.  Control of Mold Allergen   Mold and fungi can grow on a  variety of surfaces provided certain temperature and moisture conditions exist.  Outdoor molds grow on plants, decaying vegetation and soil.  The major outdoor mold, Alternaria and Cladosporium, are found in very high numbers during hot and dry conditions.  Generally, a late Summer - Fall peak is seen for common outdoor fungal  spores.  Rain will temporarily lower outdoor mold spore count, but counts rise rapidly when the rainy period ends.  The most important indoor molds are Aspergillus and Penicillium.  Dark, humid and poorly ventilated basements are ideal sites for mold growth.  The next most common sites of mold growth are the bathroom and the kitchen.  Outdoor (Seasonal) Mold Control  Positive outdoor molds via skin testing: Alternaria, Cladosporium, Bipolaris (Helminthsporium), Drechslera (Curvalaria) and Mucor  1. Use air conditioning and keep windows closed 2. Avoid exposure to decaying vegetation. 3. Avoid leaf raking. 4. Avoid grain handling. 5. Consider wearing a face mask if working in moldy areas.  6.   Indoor (Perennial) Mold Control   Positive indoor molds via skin testing: Fusarium, Aureobasidium (Pullulara) and Rhizopus  1. Maintain humidity below 50%. 2. Clean washable surfaces with 5% bleach solution. 3. Remove sources e.g. contaminated carpets.     Control of Dog or Cat Allergen  Avoidance is the best way to manage a dog or cat allergy. If you have a dog or cat and are allergic to dog or cats, consider removing the dog or cat from the home. If you have a dog or cat but don't want to find it a new home, or if your family wants a pet even though someone in the household is allergic, here are some strategies that may help keep symptoms at bay:  1. Keep the pet out of your bedroom and restrict it to only a few rooms. Be advised that keeping the dog or cat in only one room will not limit the allergens to that room. 2. Don't pet, hug or kiss the dog or cat; if you do, wash your hands with soap and water. 3. High-efficiency particulate air (HEPA) cleaners run continuously in a bedroom or living room can reduce allergen levels over time. 4. Regular use of a high-efficiency vacuum cleaner or a central vacuum can reduce allergen levels. 5. Giving your dog or cat a bath at least once a week can  reduce airborne allergen.  Control of House Dust Mite Allergen    House dust mites play a major role in allergic asthma and rhinitis.  They occur in environments with high humidity wherever human skin, the food for dust mites is found. High levels have been detected in dust obtained from mattresses, pillows, carpets, upholstered furniture, bed covers, clothes and soft toys.  The principal allergen of the house dust mite is found in its feces.  A gram of dust may contain 1,000 mites and 250,000 fecal particles.  Mite antigen is easily measured in the air during house cleaning activities.    1. Encase mattresses, including the box spring, and pillow, in an air tight cover.  Seal the zipper end of the encased mattresses with wide adhesive tape. 2. Wash the bedding in water of 130 degrees Farenheit weekly.  Avoid cotton comforters/quilts and flannel bedding: the most ideal bed covering is the dacron comforter. 3. Remove all upholstered furniture from the bedroom. 4. Remove carpets, carpet padding, rugs, and non-washable window drapes from the bedroom.  Wash drapes weekly or use plastic window coverings. 5. Remove all non-washable stuffed toys from the  bedroom.  Wash stuffed toys weekly. 6. Have the room cleaned frequently with a vacuum cleaner and a damp dust-mop.  The patient should not be in a room which is being cleaned and should wait 1 hour after cleaning before going into the room. 7. Close and seal all heating outlets in the bedroom.  Otherwise, the room will become filled with dust-laden air.  An electric heater can be used to heat the room. 8. Reduce indoor humidity to less than 50%.  Do not use a humidifier.  Allergy Shots   Allergies are the result of a chain reaction that starts in the immune system. Your immune system controls how your body defends itself. For instance, if you have an allergy to pollen, your immune system identifies pollen as an invader or allergen. Your immune system  overreacts by producing antibodies called Immunoglobulin E (IgE). These antibodies travel to cells that release chemicals, causing an allergic reaction.  The concept behind allergy immunotherapy, whether it is received in the form of shots or tablets, is that the immune system can be desensitized to specific allergens that trigger allergy symptoms. Although it requires time and patience, the payback can be long-term relief.  How Do Allergy Shots Work?  Allergy shots work much like a vaccine. Your body responds to injected amounts of a particular allergen given in increasing doses, eventually developing a resistance and tolerance to it. Allergy shots can lead to decreased, minimal or no allergy symptoms.  There generally are two phases: build-up and maintenance. Build-up often ranges from three to six months and involves receiving injections with increasing amounts of the allergens. The shots are typically given once or twice a week, though more rapid build-up schedules are sometimes used.  The maintenance phase begins when the most effective dose is reached. This dose is different for each person, depending on how allergic you are and your response to the build-up injections. Once the maintenance dose is reached, there are longer periods between injections, typically two to four weeks.  Occasionally doctors give cortisone-type shots that can temporarily reduce allergy symptoms. These types of shots are different and should not be confused with allergy immunotherapy shots.  Who Can Be Treated with Allergy Shots?  Allergy shots may be a good treatment approach for people with allergic rhinitis (hay fever), allergic asthma, conjunctivitis (eye allergy) or stinging insect allergy.   Before deciding to begin allergy shots, you should consider:  . The length of allergy season and the severity of your symptoms . Whether medications and/or changes to your environment can control your symptoms . Your  desire to avoid long-term medication use . Time: allergy immunotherapy requires a major time commitment . Cost: may vary depending on your insurance coverage  Allergy shots for children age 53 and older are effective and often well tolerated. They might prevent the onset of new allergen sensitivities or the progression to asthma.  Allergy shots are not started on patients who are pregnant but can be continued on patients who become pregnant while receiving them. In some patients with other medical conditions or who take certain common medications, allergy shots may be of risk. It is important to mention other medications you talk to your allergist.   When Will I Feel Better?  Some may experience decreased allergy symptoms during the build-up phase. For others, it may take as long as 12 months on the maintenance dose. If there is no improvement after a year of maintenance, your allergist will discuss other treatment  options with you.  If you aren't responding to allergy shots, it may be because there is not enough dose of the allergen in your vaccine or there are missing allergens that were not identified during your allergy testing. Other reasons could be that there are high levels of the allergen in your environment or major exposure to non-allergic triggers like tobacco smoke.  What Is the Length of Treatment?  Once the maintenance dose is reached, allergy shots are generally continued for three to five years. The decision to stop should be discussed with your allergist at that time. Some people may experience a permanent reduction of allergy symptoms. Others may relapse and a longer course of allergy shots can be considered.  What Are the Possible Reactions?  The two types of adverse reactions that can occur with allergy shots are local and systemic. Common local reactions include very mild redness and swelling at the injection site, which can happen immediately or several hours after. A  systemic reaction, which is less common, affects the entire body or a particular body system. They are usually mild and typically respond quickly to medications. Signs include increased allergy symptoms such as sneezing, a stuffy nose or hives.  Rarely, a serious systemic reaction called anaphylaxis can develop. Symptoms include swelling in the throat, wheezing, a feeling of tightness in the chest, nausea or dizziness. Most serious systemic reactions develop within 30 minutes of allergy shots. This is why it is strongly recommended you wait in your doctor's office for 30 minutes after your injections. Your allergist is trained to watch for reactions, and his or her staff is trained and equipped with the proper medications to identify and treat them.  Who Should Administer Allergy Shots?  The preferred location for receiving shots is your prescribing allergist's office. Injections can sometimes be given at another facility where the physician and staff are trained to recognize and treat reactions, and have received instructions by your prescribing allergist.

## 2019-03-10 NOTE — Progress Notes (Signed)
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 Chronic rhinitis - Testing today showed: grasses, ragweed, indoor molds, dust mites, cat and dog - Copy of test results provided. - Avoidance measures provided. - Continue with: Xyzal (levocetirizine) 70m tablet once daily - Start taking: Nasacort (triamcinolone) two sprays per nostril daily and Astelin (azelastine) 2 sprays per nostril 1-2 times daily as needed - You can use an extra dose of the antihistamine, if needed, for breakthrough symptoms.  - Consider nasal saline rinses 1-2 times daily to remove allergens from the nasal cavities as well as help with mucous clearance (this is especially helpful to do before the nasal sprays are given) - Consider allergy shots as a means of long-term control. - Allergy shots "re-train" and "reset" the immune system to ignore environmental allergens and decrease the resulting immune response to those allergens (sneezing, itchy watery eyes, runny nose, nasal congestion, etc).    - Allergy shots improve symptoms in 75-85% of patients.  - Make an appointment to start allergy shots in two weeks.   2. Return in about 3 months (around 06/10/2019). This can be an in-person, a virtual Webex or a telephone follow up visit.   Subjective:   Melinda MEleaner Dibartolois a 23y.o. female presenting today for follow up of  Chief Complaint  Patient presents with  . Allergy Testing    Melinda Gray a history of the following: Patient Active Problem List   Diagnosis Date Noted  . Seasonal and perennial allergic rhinitis 03/10/2019  . Viral URI with cough 09/25/2018   . Shortness of breath 09/25/2018  . Migraine headache without aura 05/18/2018  . Allergic rhinitis 05/18/2018  . GERD (gastroesophageal reflux disease) 05/18/2018  . Aseptic meningitis 05/07/2018  . Primary immune deficiency disorder ([facility] 05/06/2018  . SIRS (systemic inflammatory response syndrome) ([facility] 05/05/2018    History obtained from: chart review and patient.  [name] a 23y.o. female presenting for skin testing.  She was last seen in July 2020.  At that time, we continued Cuvitru every other week for now.  She was not using it very regularly, but I emphasized the need to use it regularly to avoid infections.  For her rhinitis, we continued with Xyzal daily and nasal saline gel.  She presents today for environmental allergy testing.  For her asthma, we started a low-dose prednisone pack to get her back under control and started Qvar 80 mcg 2 puffs twice daily with albuterol 2 puffs every 4-6 hours as needed.  Since last visit, she has done well.  She is 1 week postop from her right hip surgery.  She reports that this is the fourth surgery on that hip.  She has been tentatively diagnosed with Ehlers-Danlos syndrome, although she has never been evaluated by genetics.  She tells me the waiting list as well over a year long.  She tolerated her surgery well.  She has met all of the physical therapy goals in 1 week that she was supposed to do in 4 weeks.  She has been off of her Xyzal.  She is excited about getting allergy tested in figuring out what is causing some of her symptoms.  Her  asthma is under good control.  She is on the Qvar 2 puffs in the morning and 2 puffs at night.  Her next infusion of immunoglobulin is tomorrow.  She has been much more regular with the infusions as requested at the last visit.  Otherwise, there have been no changes to her past medical history, surgical history, family history, or social history.    Review of Systems  Constitutional: Negative.  Negative  for chills, fever, malaise/fatigue and weight loss.  HENT: Positive for congestion, sinus pain and sore throat. Negative for ear discharge and ear pain.   Eyes: Negative for pain, discharge and redness.  Respiratory: Negative for cough, sputum production, shortness of breath and wheezing.   Cardiovascular: Negative.  Negative for chest pain and palpitations.  Gastrointestinal: Negative for abdominal pain, constipation, diarrhea, heartburn, nausea and vomiting.  Musculoskeletal: Positive for joint pain and myalgias.  Skin: Negative.  Negative for itching and rash.  Neurological: Negative for dizziness and headaches.  Endo/Heme/Allergies: Positive for environmental allergies. Does not bruise/bleed easily.       Objective:   Blood pressure 106/68, pulse 90, resp. rate 18, SpO2 97 %. There is no height or weight on file to calculate BMI.   Physical Exam:  Physical Exam  Constitutional: She appears well-developed.  Pleasant female as always.  HENT:  Head: Normocephalic and atraumatic.  Right Ear: Tympanic membrane, external ear and ear canal normal.  Left Ear: Tympanic membrane, external ear and ear canal normal.  Nose: Mucosal edema and rhinorrhea present. No nasal deformity or septal deviation. No epistaxis. Right sinus exhibits no maxillary sinus tenderness and no frontal sinus tenderness. Left sinus exhibits no maxillary sinus tenderness and no frontal sinus tenderness.  Mouth/Throat: Uvula is midline and oropharynx is clear and moist. Mucous membranes are not pale and not dry.  Mild to moderate cobblestoning in the posterior oropharynx.  Tonsils 2+ bilaterally.  Eyes: Pupils are equal, round, and reactive to light. Conjunctivae and EOM are normal. Right eye exhibits no chemosis and no discharge. Left eye exhibits no chemosis and no discharge. Right conjunctiva is not injected. Left conjunctiva is not injected.  Cardiovascular: Normal rate, regular rhythm and normal heart sounds.   Respiratory: Effort normal and breath sounds normal. No accessory muscle usage. No tachypnea. No respiratory distress. She has no wheezes. She has no rhonchi. She has no rales. She exhibits no tenderness.  Lymphadenopathy:    She has no cervical adenopathy.  Neurological: She is alert.  Skin: No abrasion, no petechiae and no rash noted. Rash is not papular, not vesicular and not urticarial. No erythema. No pallor.  Psychiatric: She has a normal mood and affect.     Diagnostic studies:   Allergy Studies:    Airborne Adult Perc - 03/10/19 0936    Time Antigen Placed  0940    Allergen Manufacturer  Lavella Hammock    Location  Back    Number of Test  59    1. Control-Buffer 50% Glycerol  Negative    2. Control-Histamine 1 mg/ml  2+    3. Albumin saline  Negative    4. Felicity  Negative    5. [place]  Negative    6. Johnson  Negative    7. [place] Blue  Negative    8. Meadow Fescue  Negative    9. Perennial Rye  Negative    10. Sweet Vernal  Negative    11. Timothy  Negative    12. Cocklebur  Negative  13. Burweed Marshelder  Negative    14. Ragweed, short  Negative    15. Ragweed, Giant  Negative    16. Plantain,  English  Negative    17. Lamb's Quarters  Negative    18. Sheep Sorrell  Negative    19. Rough Pigweed  Negative    20. Marsh Elder, Rough  Negative    21. Mugwort, Common  Negative    22. Ash mix  Negative    23. Birch mix  Negative    24. Beech American  Negative    25. Box, Elder  Negative    26. Cedar, red  Negative    27. Cottonwood, [place]  Negative    28. Elm mix  Negative    29. Hickory mix  Negative    30. Maple mix  Negative    31. Oak, [place] mix  Negative    32. Pecan Pollen  Negative    33. Pine mix  Negative    34. Sycamore Eastern  Negative    35. [place], Black Pollen  Negative    36. Alternaria alternata  Negative    37. Cladosporium Herbarum  Negative    38. Aspergillus mix  Negative    39. Penicillium mix  Negative    40. Bipolaris  sorokiniana (Helminthosporium)  Negative    41. Drechslera spicifera (Curvularia)  Negative    42. Mucor plumbeus  Negative    43. Fusarium moniliforme  Negative    44. Aureobasidium pullulans (pullulara)  Negative    45. Rhizopus oryzae  Negative    46. Botrytis cinera  Negative    47. Epicoccum nigrum  Negative    48. Phoma betae  Negative    49. Candida Albicans  Negative    50. Trichophyton mentagrophytes  Negative    51. Mite, D Farinae  5,000 AU/ml  Negative    52. Mite, D Pteronyssinus  5,000 AU/ml  Negative    53. Cat Hair 10,000 BAU/ml  Negative    54.  Dog Epithelia  Negative    55. Mixed Feathers  Negative    56. Horse Epithelia  Negative    57. Cockroach, German  Negative    58. Mouse  Negative    59. Tobacco Leaf  Negative     Intradermal - 03/10/19 1013    Time Antigen Placed  1013    Allergen Manufacturer  Greer    Location  Arm    Number of Test  15    Control  Negative    [place]  1+    Johnson  Negative    7 Grass  1+    Ragweed mix  2+    Weed mix  Negative    Tree mix  Negative    Mold 1  1+    Mold 2  Negative    Mold 3  1+    Mold 4  1+    Cat  1+    Dog  3+    Cockroach  Negative    Mite mix  1+       Allergy testing results were read and interpreted by myself, documented by clinical staff.      Salvatore Marvel, MD  [facility]

## 2019-03-14 DIAGNOSIS — J301 Allergic rhinitis due to pollen: Secondary | ICD-10-CM

## 2019-03-14 NOTE — Progress Notes (Signed)
VIALS EXP 03-14-20 

## 2019-03-15 DIAGNOSIS — J301 Allergic rhinitis due to pollen: Secondary | ICD-10-CM | POA: Diagnosis not present

## 2019-03-16 DIAGNOSIS — J3089 Other allergic rhinitis: Secondary | ICD-10-CM

## 2019-03-22 ENCOUNTER — Encounter: Payer: Self-pay | Admitting: Family Medicine

## 2019-03-23 ENCOUNTER — Telehealth: Payer: 59 | Admitting: Family

## 2019-03-23 DIAGNOSIS — J019 Acute sinusitis, unspecified: Secondary | ICD-10-CM | POA: Diagnosis not present

## 2019-03-23 MED ORDER — DOXYCYCLINE HYCLATE 100 MG PO TABS: 100.0000 mg | ORAL_TABLET | Freq: Two times a day (BID) | ORAL | 0 refills | Status: DC

## 2019-03-23 NOTE — Addendum Note (Signed)
Addended by: Evelina Dun A on: 03/23/2019 06:35 PM   Modules accepted: Orders

## 2019-03-23 NOTE — Progress Notes (Signed)
We are sorry that you are not feeling well.  Here is how we plan to help!  Based on what you have shared with me it looks like you have sinusitis.  Sinusitis is inflammation and infection in the sinus cavities of the head.  Based on your presentation I believe you most likely have Acute Bacterial Sinusitis.  This is an infection caused by bacteria and is treated with antibiotics. I have prescribed Doxycycline 100mg  by mouth twice a day for 10 days. You may use an oral decongestant such as Mucinex D or if you have glaucoma or high blood pressure use plain Mucinex. Saline nasal spray help and can safely be used as often as needed for congestion.  If you develop worsening sinus pain, fever or notice severe headache and vision changes, or if symptoms are not better after completion of antibiotic, please schedule an appointment with a health care provider.    Given your symptoms you do need to be COVID tested. You can go to one of the  testing sites listed below, while they are opened (see hours). You do not need an order and will stay in your car during the test. You do need to self isolate until your results return and if positive 14 days from when your symptoms started and until you are 3 days symptom free.   Testing Locations (Monday - Friday, 8 a.m. - 3:30 p.m.) . [place]: [facility], [address], [place], [place]  . [place], [address], [place], [place] (entrance off [address])  . [facility]: (Closed each Monday): Testing site relocated to the short stay covered drive at [facility]. (Use the [address] entrance to [facility] next to [facility].)  Approximately 5 minutes was spent documenting and reviewing patient's chart.   Sinus infections are not as easily transmitted as other respiratory infection, however we still recommend that you avoid close contact with loved ones,  especially the very young and elderly.  Remember to wash your hands thoroughly throughout the day as this is the number one way to prevent the spread of infection!  Home Care:  Only take medications as instructed by your medical team.  Complete the entire course of an antibiotic.  Do not take these medications with alcohol.  A steam or ultrasonic humidifier can help congestion.  You can place a towel over your head and breathe in the steam from hot water coming from a faucet.  Avoid close contacts especially the very young and the elderly.  Cover your mouth when you cough or sneeze.  Always remember to wash your hands.  Get Help Right Away If:  You develop worsening fever or sinus pain.  You develop a severe head ache or visual changes.  Your symptoms persist after you have completed your treatment plan.  Make sure you  Understand these instructions.  Will watch your condition.  Will get help right away if you are not doing well or get worse.  Your e-visit answers were reviewed by a board certified advanced clinical practitioner to complete your personal care plan.  Depending on the condition, your plan could have included both over the counter or prescription medications.  If there is a problem please reply  once you have received a response from your provider.  Your safety is important to us.  If you have drug allergies check your prescription carefully.    You can use  MyChart to ask questions about today's visit, request a non-urgent call back, or ask for a work or school excuse for 24 hours related to this e-Visit. If it has been greater than 24 hours you will need to follow up with your provider, or enter a new e-Visit to address those concerns.  You will get an e-mail in the next two days asking about your experience.  I hope that your e-visit has been valuable and will speed your recovery. Thank you for using e-visits.

## 2019-03-28 ENCOUNTER — Ambulatory Visit: Payer: 59 | Admitting: Family Medicine

## 2019-03-31 ENCOUNTER — Ambulatory Visit: Payer: 59

## 2019-04-04 ENCOUNTER — Telehealth: Payer: 59 | Admitting: Family Medicine

## 2019-04-07 ENCOUNTER — Ambulatory Visit (INDEPENDENT_AMBULATORY_CARE_PROVIDER_SITE_OTHER): Payer: 59

## 2019-04-07 ENCOUNTER — Other Ambulatory Visit: Payer: Self-pay

## 2019-04-07 DIAGNOSIS — J302 Other seasonal allergic rhinitis: Secondary | ICD-10-CM | POA: Diagnosis not present

## 2019-04-07 DIAGNOSIS — J3089 Other allergic rhinitis: Secondary | ICD-10-CM | POA: Diagnosis not present

## 2019-04-07 NOTE — Progress Notes (Signed)
Immunotherapy   Patient Details  Name: Melinda Gray MRN: [id number] Date of Birth: 04/09/1996  04/07/2019  Melinda Gray started injections for  MOLDS-DM & G-RW-C-D Following schedule: B  Frequency:2 times per week Epi-Pen:Epi-Pen Available  Consent signed and patient instructions given. Patient waited 30 minutes post injection in office.  No local or systemic reactions.  Rosalio Loud 04/07/2019, 2:31 PM

## 2019-05-16 ENCOUNTER — Telehealth: Payer: 59 | Admitting: Physician Assistant

## 2019-05-16 DIAGNOSIS — R300 Dysuria: Secondary | ICD-10-CM

## 2019-05-16 NOTE — Progress Notes (Signed)
Hi Melinda Gray,  I am sorry you are not feeling well.  I am worried about your back pain as well as your history of kidney problems.  I would feel more comfortable if you were seen by a medical provider face-to-face so you can provide a urine sample and have a physical exam.  If you cannot get in to see your PCP, I've provided a list of our Urgent Care locations.    Based on what you shared with me, I feel your condition warrants further evaluation and I recommend that you be seen for a face to face office visit.  NOTE: If you entered your credit card information for this eVisit, you will not be charged. You may see a "hold" on your card for the $35 but that hold will drop off and you will not have a charge processed.  If you are having a true medical emergency please call 911.     For an urgent face to face visit, [place] has four urgent care centers for your convenience:    NEW:  [facility] [place] [address] [place], [place] [address] .  Monday - Friday 10 am - 6 pm    . [facility]    [phone]                  Get Driving Directions  [address] [place], [place] [address] . 10 am to 8 pm Monday-Friday . 12 pm to 8 pm Saturday-Sunday   . [facility] at [place]                  Get Driving Directions  [address], [place], [place] [address] . 8 am to 8 pm Monday-Friday . 9 am to 6 pm Saturday . 11 am to 6 pm Sunday     . [facility] at [place]                  Get Driving Directions   [address] [place], [place] [address] . 8 am to 8 pm Monday-Friday . 8 am to 4 pm Saturday-Sunday    . [facility] at [place]                    Get Driving Directions  [phone]  [address] [place], [place] [address]  . Monday-Friday, 12 PM to 6 PM    Your e-visit  answers were reviewed by a board certified advanced clinical practitioner to complete your personal care plan.  Thank you for using e-Visits.

## 2019-05-18 ENCOUNTER — Other Ambulatory Visit: Payer: Self-pay

## 2019-05-18 ENCOUNTER — Ambulatory Visit: Payer: 59 | Admitting: Family Medicine

## 2019-06-14 ENCOUNTER — Ambulatory Visit: Payer: 59 | Admitting: Allergy & Immunology

## 2019-06-23 ENCOUNTER — Other Ambulatory Visit: Payer: Self-pay

## 2019-06-23 ENCOUNTER — Encounter: Payer: Self-pay | Admitting: Allergy & Immunology

## 2019-06-23 ENCOUNTER — Ambulatory Visit (INDEPENDENT_AMBULATORY_CARE_PROVIDER_SITE_OTHER): Payer: 59 | Admitting: Allergy & Immunology

## 2019-06-23 VITALS — BP 122/60 | HR 98 | Temp 97.9°F | Resp 18

## 2019-06-23 DIAGNOSIS — Q796 Ehlers-Danlos syndrome, unspecified: Secondary | ICD-10-CM | POA: Diagnosis not present

## 2019-06-23 DIAGNOSIS — J309 Allergic rhinitis, unspecified: Secondary | ICD-10-CM

## 2019-06-23 DIAGNOSIS — J3089 Other allergic rhinitis: Secondary | ICD-10-CM

## 2019-06-23 DIAGNOSIS — D803 Selective deficiency of immunoglobulin G [IgG] subclasses: Secondary | ICD-10-CM | POA: Diagnosis not present

## 2019-06-23 DIAGNOSIS — J4530 Mild persistent asthma, uncomplicated: Secondary | ICD-10-CM | POA: Insufficient documentation

## 2019-06-23 DIAGNOSIS — J302 Other seasonal allergic rhinitis: Secondary | ICD-10-CM

## 2019-06-23 MED ORDER — ALBUTEROL SULFATE HFA 108 (90 BASE) MCG/ACT IN AERS: 2.0000 | INHALATION_SPRAY | RESPIRATORY_TRACT | 1 refills | Status: AC | PRN

## 2019-06-23 MED ORDER — QVAR REDIHALER 80 MCG/ACT IN AERB: 2.0000 | INHALATION_SPRAY | Freq: Two times a day (BID) | RESPIRATORY_TRACT | 5 refills | Status: AC

## 2019-06-23 NOTE — Progress Notes (Signed)
Immunotherapy   Patient Details  Name: Dakisha Schoof MRN: [id number] Date of Birth: 01/11/1996  06/23/2019  Kristine Garbe started injections for  MOLDS-DM, G-RW-C-D Following schedule: B  Frequency:2 times per week Epi-Pen: Yes Consent signed and patient instructions given. Patient re-started allergy injections and received .24mL of MOLDS-DM in the RUA and .56mL G-RW-C-D. Patient waited 30 minutes and did not experience any issues.    Tina Temme Fernandez-Vernon 06/23/2019, 4:08 PM

## 2019-06-23 NOTE — Patient Instructions (Addendum)
1. IgG subclass deficiency - Continue with Cuvitru every other week for now at the same schedule (EVERY TWO WEEKS).  - This needs to be taken regularly. - We will get a repeat IgG level once you have been on the Cuvitru regularly. - Come back on Monday for a flu shot.   2. Chronic rhinitis (grasses, ragweed, indoor molds, dust mites, cat and dog) - Continue with: Xyzal (levocetirizine) 5mg  tablet once daily, Nasacort (triamcinolone) two sprays per nostril daily and Astelin (azelastine) 2 sprays per nostril 1-2 times daily as needed - Continue with allergy shots (restarted today). - Come regularly so that we can work up to your maintenance.   3. Mild persistent asthma, uncomplicated - Lung testing looks good today. - Daily controller medication(s): NOTHING - Prior to physical activity: albuterol 2 puffs 10-15 minutes before physical activity. - Rescue medications: albuterol 4 puffs every 4-6 hours as needed - Changes during respiratory infections or worsening symptoms: Add on Qvar 59mcg to 4 puffs twice daily for ONE TO TWO WEEKS. - Asthma control goals:  * Full participation in all desired activities (may need albuterol before activity) * Albuterol use two time or less a week on average (not counting use with activity) * Cough interfering with sleep two time or less a month * Oral steroids no more than once a year * No hospitalizations  4. Return in about 3 months (around 09/21/2019). This can be an in-person, a virtual Webex or a telephone follow up visit.   Please inform us of any Emergency Department visits, hospitalizations, or changes in symptoms. Call us before going to the ED for breathing or allergy symptoms since we might be able to fit you in for a sick visit. Feel free to contact us anytime with any questions, problems, or concerns.  It was a pleasure to see you again today!  Websites that have reliable patient information: 1. American Academy of Asthma, Allergy, and  Immunology: www.aaaai.org 2. Food Allergy Research and Education (FARE): foodallergy.org 3. Mothers of Asthmatics: http://www.asthmacommunitynetwork.org 4. American College of Allergy, Asthma, and Immunology: www.acaai.org  "Like" [place] on Facebook and Instagram for our latest updates!      Make sure you are registered to vote! If you have moved or changed any of your contact information, you will need to get this updated before voting!  In some cases, you MAY be able to register to vote online: [email or web address]

## 2019-06-23 NOTE — Progress Notes (Signed)
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 IgG subclass deficiency - Continue with Cuvitru every other week for now at the same schedule (EVERY TWO WEEKS).  - This needs to be taken regularly. - We will get a repeat IgG level once you have been on the Cuvitru regularly. - Come back on Monday for a flu shot.   2. Chronic rhinitis (grasses, ragweed, indoor molds, dust mites, cat and dog) - Continue with: Xyzal (levocetirizine) 5mg  tablet once daily, Nasacort (triamcinolone) two sprays per nostril daily and Astelin (azelastine) 2 sprays per nostril 1-2 times daily as needed - Continue with allergy shots (restarted today). - Come regularly so that we can work up to your maintenance.   3. Mild persistent asthma, uncomplicated - Lung testing looks good today. - Daily controller medication(s): NOTHING - Prior to physical activity: albuterol 2 puffs 10-15 minutes before physical activity. - Rescue medications: albuterol 4 puffs every 4-6 hours as needed - Changes during respiratory infections or worsening symptoms: Add on Qvar 80mcg to 4 puffs twice daily for ONE TO TWO WEEKS. - Asthma control goals:  * Full participation in all desired activities (may need albuterol before activity) * Albuterol use two time or less a week on average (not counting use with activity) * Cough interfering with sleep two time or less a month * Oral steroids no more than once a year * No hospitalizations  4. Return in about 3 months (around 09/21/2019). This can be an in-person, a virtual  Webex or a telephone follow up visit.  Subjective:   Melinda Gray is a 23 y.o. female presenting today for follow up of  Chief Complaint  Patient presents with  . Follow-up    Melinda Gray has a history of the following: Patient Active Problem List   Diagnosis Date Noted  . Seasonal and perennial allergic rhinitis 03/10/2019  . Viral URI with cough 09/25/2018  . Shortness of breath 09/25/2018  . Migraine headache without aura 05/18/2018  . Allergic rhinitis 05/18/2018  . GERD (gastroesophageal reflux disease) 05/18/2018  . Aseptic meningitis 05/07/2018  . Primary immune deficiency disorder 05/06/2018  . SIRS (systemic inflammatory response syndrome) (HCC) 05/05/2018    History obtained from: chart review and patient.  Melinda Gray is a [age] y.o. female presenting for a follow up visit.  She was last seen in August 2020.  At that time, she had testing that was positive to grasses, ragweed, indoor and molds, dust mite, cat, and dog.  She decided to start allergen immunotherapy.  We continue with Xyzal and I started Nasacort and Astelin.  She also has a history of IgG subclass deficiency and has been on Cuvitru 16 g every 2 weeks, which equates to 511 mix per kick per month.  Since the last visit, she has mostly done well. She does tell me that she has been inundated with catching up with school work due to her surgeries.   Asthma/Respiratory Symptom History: She has bee using her rescue inhaler 1-2 times per month at the most. She is using the Qvar only when she has a flare up. Overall her breathing has been very well controlled.  She reports good sleeping at night. She is not having coughing or wheezing at all. She has not been to the ED and has not required any systemic steroids at all. ACT is 19 today, indicating excellent asthma control.   Allergic Rhinitis Symptom History: She did receive one allergy shot but then stopped following up for the shots. But then she was  so behind in school that she worked hard to catch up on this. She was behind because of all of the surgeries. She does feel that she has been more able to tolerate her dog more, even after just one shot.   Infection Symptom History: She did have a viral infection for one month. She did have antibiotics for a wound infection for a labrum repair of her right hip socket. She has been getting her immunoglobulin only once per month. She apparently has a large supply of the medication at home stocked up since they seem to just send it to her automatically. Apparently she has not paid $75, so they stopped sending her the medication.   She still has yet to be referred for the Ehlers-Danlos clinic. She keeps thinking that the orthopedic surgeons are going to refer her. She would like [place] to refer her. Apparently the aiting list is around one year or so. She has been told by a multitude of orthopedic surgeons that she likely has Ehlers-Danlos.   Otherwise, there have been no changes to her past medical history, surgical history, family history, or social history.    Review of Systems  Constitutional: Negative.  Negative for chills, fever, malaise/fatigue and weight loss.  HENT: Negative.  Negative for congestion, ear discharge, ear pain, sinus pain and sore throat.   Eyes: Negative for pain, discharge and redness.  Respiratory: Negative for cough, sputum production, shortness of breath and wheezing.   Cardiovascular: Negative.  Negative for chest pain and palpitations.  Gastrointestinal: Negative for abdominal pain, constipation, diarrhea, heartburn, nausea and vomiting.  Skin: Negative.  Negative for itching and rash.  Neurological: Negative for dizziness and headaches.  Endo/Heme/Allergies: Positive for environmental allergies. Does not bruise/bleed easily.       Objective:   Blood pressure 122/60, pulse 98, temperature 97.9 F (36.6 C), temperature source Temporal, resp. rate 18, SpO2 100  %. There is no height or weight on file to calculate BMI.   Physical Exam:  Physical Exam  Constitutional: She appears well-developed.  Pleasant female.   HENT:  Head: Normocephalic and atraumatic.  Right Ear: Tympanic membrane, external ear and ear canal normal.  Left Ear: Tympanic membrane and ear canal normal.  Nose: No mucosal edema, rhinorrhea, nasal deformity or septal deviation. No epistaxis. Right sinus exhibits no maxillary sinus tenderness and no frontal sinus tenderness. Left sinus exhibits no maxillary sinus tenderness and no frontal sinus tenderness.  Mouth/Throat: Uvula is midline and oropharynx is clear and moist. Mucous membranes are not pale and not dry.  Eyes: Pupils are equal, round, and reactive to light. Conjunctivae and EOM are normal. Right eye exhibits no chemosis and no discharge. Left eye exhibits no chemosis and no discharge. Right conjunctiva is not injected. Left conjunctiva is not injected.  Cardiovascular: Normal rate, regular rhythm and normal heart sounds.  Respiratory: Effort normal and breath sounds normal. No accessory muscle usage. No tachypnea. No respiratory distress. She has no wheezes. She has no rhonchi. She has no rales. She exhibits no tenderness.  Moving air well in all lung fields. No increased work of breathing noted.  Lymphadenopathy:    She has no cervical adenopathy.  Neurological: She is alert.  Skin: No abrasion, no petechiae and no rash noted. Rash is not papular, not vesicular and not urticarial. No erythema. No pallor.  Psychiatric: She has a normal mood and affect.     Diagnostic studies:   Spirometry: Normal FEV1, FVC, and FEV1/FVC ratio. There is no scooping suggestive of obstructive disease.         Malachi Bonds, MD  [facility]

## 2019-06-27 ENCOUNTER — Telehealth: Payer: Self-pay

## 2019-06-27 ENCOUNTER — Other Ambulatory Visit: Payer: Self-pay | Admitting: *Deleted

## 2019-06-27 NOTE — Telephone Encounter (Signed)
Qvar is not covered here are the ones that are covered according the requirement for the insurances:  Asmanex HFA 100 Mcg/Actuation HFAA tier-8, QL Asmanex Twisthaler 220 Mcg/ Actuation (60) Aepb tier-10, QL  Asmanex Twisthaler 220 Mcg/ Actuation (30) Aepb tier-11, QL  Alvesco 160 Mcg/Actuation HFAA tier-12, QL Asmanex Twisthaler 220 Mcg/ Actuation (120) Aepb tier-13, QL  Flovent HFA 110 Mcg/Actuation HFAA tier-1, QL  Arnuity Ellipta 100 Mcg/Actuation Dsdv tier-2, QL  Symbicort 160-4.5 Mcg/Actuation HFAA tier-4, QL  Qvar 80 Mcg/Actuation Aero tier-5  Flovent Diskus 100 Mcg/Actuation Dsdv tier-6, QL  Breo Ellipta 100-25 Mcg/Dose Dsdv tier-9, QL

## 2019-06-28 ENCOUNTER — Ambulatory Visit: Payer: 59

## 2019-06-28 ENCOUNTER — Other Ambulatory Visit: Payer: Self-pay | Admitting: *Deleted

## 2019-06-28 MED ORDER — ASMANEX (60 METERED DOSES) 220 MCG/INH IN AEPB: 2.0000 | INHALATION_SPRAY | Freq: Two times a day (BID) | RESPIRATORY_TRACT | 5 refills | Status: AC | PRN

## 2019-06-28 NOTE — Telephone Encounter (Signed)
Let's change to Asthma Twisthaler 266mcg two puffs twice daily PRN.   Salvatore Marvel, MD [facility] [place]

## 2019-06-28 NOTE — Telephone Encounter (Signed)
New inhaler has been sent in. Called patient and informed of change. Patient verbalized understanding.

## 2019-10-05 ENCOUNTER — Telehealth: Payer: Self-pay | Admitting: Allergy & Immunology

## 2019-10-05 NOTE — Telephone Encounter (Signed)
pt  Called and said that she got covid on the 09/25/2019 and then she started filling better on one day and then she got it again. She said that she has pressure in her sinus and fever is 99.5 . [facility] [address]. [phone]

## 2019-10-05 NOTE — Telephone Encounter (Signed)
Let's add her on for a televisit tomorrow. You can make some room somewhere on my schedule.   Malachi Bonds

## 2019-10-05 NOTE — Telephone Encounter (Signed)
Called and spoke with patient.  She went for rapid testing at [facility] 09/27/19 and was confirmed Covid positive.  Patient states she was doing better then started with increased sinus pressure and headache that has gotten worse over the past 48 hours.  She does have some ear pain.  Cough is productive at times, but she is not sure if she has any colored congestion.  Temp is now 99 and not 101 like it was last week.  Patient is concerned she is getting a secondary infection or sinus infection and wants to have visit.  Patient is using her Albuterol inhaler 1-2 times a day.  Her neb cup and tubing did get destroyed by her dog and she has not been able to use her nebulizer.  Patient is taking Mucinex Cold and Flu and Tylenol as needed.  Patient is using her other medications as prescribed. Patient is in agreement for Televist with Dr. Dellis Anes at 8:00 am tomorrow.  Patient informed there is provider on call at all times and to go to the ER if she develops shortness of breath.

## 2019-10-06 ENCOUNTER — Other Ambulatory Visit: Payer: Self-pay

## 2019-10-06 ENCOUNTER — Ambulatory Visit (INDEPENDENT_AMBULATORY_CARE_PROVIDER_SITE_OTHER): Payer: 59 | Admitting: Allergy & Immunology

## 2019-10-06 ENCOUNTER — Encounter: Payer: Self-pay | Admitting: Allergy & Immunology

## 2019-10-06 DIAGNOSIS — J3089 Other allergic rhinitis: Secondary | ICD-10-CM

## 2019-10-06 DIAGNOSIS — J4530 Mild persistent asthma, uncomplicated: Secondary | ICD-10-CM | POA: Diagnosis not present

## 2019-10-06 DIAGNOSIS — U071 COVID-19: Secondary | ICD-10-CM | POA: Diagnosis not present

## 2019-10-06 DIAGNOSIS — D803 Selective deficiency of immunoglobulin G [IgG] subclasses: Secondary | ICD-10-CM | POA: Diagnosis not present

## 2019-10-06 DIAGNOSIS — Q796 Ehlers-Danlos syndrome, unspecified: Secondary | ICD-10-CM | POA: Diagnosis not present

## 2019-10-06 DIAGNOSIS — J302 Other seasonal allergic rhinitis: Secondary | ICD-10-CM

## 2019-10-06 MED ORDER — DEXAMETHASONE 4 MG PO TABS: ORAL_TABLET | ORAL | 0 refills | Status: AC

## 2019-10-06 MED ORDER — DOXYCYCLINE HYCLATE 100 MG PO TBEC: 100.0000 mg | DELAYED_RELEASE_TABLET | Freq: Two times a day (BID) | ORAL | 0 refills | Status: AC

## 2019-10-06 NOTE — Progress Notes (Signed)
RE: Melinda Gray MRN: [id number] DOB: 01-14-1996 Date of Telemedicine Visit: 10/06/2019  Referring provider: [place], Melinda G, MD Primary care provider: [place], Melinda G, MD  Chief Complaint: No chief complaint on file.   Telemedicine Follow Up Visit via Telephone: I connected with Melinda Gray for a follow up on 10/06/19 by telephone and verified that I am speaking with the correct person using two identifiers.   I discussed the limitations, risks, security and privacy concerns of performing an evaluation and management service by telephone and the availability of in person appointments. I also discussed with the patient that there may be a patient responsible charge related to this service. The patient expressed understanding and agreed to proceed.  Patient is at home. Provider is at the office.  Visit start time: 7:41 AM Visit end time: 8:03 AM Insurance consent/check in by: Dr. Reece Gray Medical consent and medical assistant/nurse: Dr. Reece Gray  History of Present Illness:  She is a 24 y.o. female, who is being followed for mild persistent asthma as well as IgG subclass deficiency and seasonal and perennial allergic rhinitis.  She also has a history of multiple orthopedic injuries with a longstanding concern of Ehlers-Danlos syndrome. Her previous allergy office visit was in December 2020 with myself.  At that visit, she was doing well from an infection standpoint.  She was not great about using her immunoglobulin, and would sometimes go 3 weeks between infusions rather than 2 weeks.  She was not having any regular breakthrough infections.  For her rhinitis, she decided to restart her allergen immunotherapy, but it seems she has not continue with that at all.  Her asthma was controlled with Qvar as needed.  Since last visit, she has mostly done well.  However, on Sunday, March 14, she started displaying symptoms of COVID-19 including sinus congestion and headaches as well as myalgias.   She did get tested on Tuesday, March 16 and was positive.  She was treating initially with over-the-counter medications including Mucinex.  She was feeling better by that Friday and felt good for a couple of days before he started feeling bad again Sunday evening.  She has continued to worsen since that time with marked nasal congestion as well as difficulty breathing through her nose, cough, and body aches.  She has been breathing okay and has not been using her rescue inhaler much at all.  She has not required any antibiotics in several months.  She cannot remember the last time she needed them.  She typically does not tolerate steroids, but is willing to give them a try if they can help her feel better.  Asthma/Respiratory Symptom History: She has the Qvar, but is using it very sparingly because her insurance is having issues covering it.  She tells me that they will cover it 1 time and then deny it when she goes to refill it.  She has to make several phone calls.  She is interested in different medications, especially if they have a co-pay card.  She has been using her inhaled steroid more consistently than she was in December overall.  She has not required any prednisone or ER visits for her breathing.  Thankfully, during this episode of COVID-19, she has not had increased work of breathing.  Allergic Rhinitis Symptom History: She remains on nasal saline rinses as needed.  She takes an antihistamine daily.  She never did continue with her allergen immunotherapy.  Infection Symptom History: She has been getting her infusions, but as  is typical for her she will go upwards of 3 weeks between infusions.  She blames this on her class load.  She is getting closer to graduation and she has been working very hard in her classes.    She actually is going to be getting married next weekend.  This is a very small ceremony and is actually a "mini elopement".  Her mother knows about it as well as some selected  friends.  Her in-laws do not know about it and her father does not know about it.  She is evidently going to be having a traditional ceremony in 2022.  She is afraid that if she tells her father that they are married, he will not pay for the ceremony.  Her fianc is in the [employer] and stationed at [employer] right now.  She will be transitioning to [employer] when she is married.  Otherwise, there have been no changes to her past medical history, surgical history, family history, or social history.  Assessment and Plan:  Melinda Gray is a 24 y.o. 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  We are going to start her on Decadron 1 dose, on the lower end dose wise due to her susceptibility is side effects, in conjunction with doxycycline 100 mg twice daily for 10 days.  Hopefully this will help jumpstart her healing process.  I did remind her to keep up with her immunoglobulin infusions.  She is going to continue with the use of the over-the-counter medicines including antipyretics as well as Mucinex.  We will call her tomorrow for an update.  Regarding her asthma, we are going to change her to ArmonAir 113 mcg one puff BID since there is a copay card for it. I have had a difficult time gauging how bad her asthma is she tends to wait on her symptoms.  She will also be using her inhaled steroid on a as needed basis at 1 point and then more regularly at the next  visit.  Diagnostics: None.  Medication List:  Current Outpatient Medications  Medication Sig Dispense Refill  . acetaminophen (TYLENOL) 325 MG tablet Take by mouth.    Marland Kitchen albuterol (PROVENTIL) (2.5 MG/3ML) 0.083% nebulizer solution Take 3 mLs (2.5 mg total) by nebulization every 6 (six) hours as needed for wheezing or shortness of breath. 150 mL 1  . albuterol (VENTOLIN HFA) 108 (90 Base) MCG/ACT inhaler Inhale 2 puffs into the lungs every 4 (four) hours as needed for wheezing or shortness of breath. 18 Gray 1  . beclomethasone (QVAR REDIHALER) 80 MCG/ACT inhaler Inhale 2 puffs into the lungs 2 (two) times daily. 10.6 Gray 5  . dexamethasone (DECADRON) 4 MG tablet Take three tablets by mouth. 3 tablet 0  . diphenhydrAMINE (BENADRYL) 25 mg capsule Take by mouth.    . doxycycline (DORYX) 100 MG EC tablet Take 1 tablet (100 mg total) by mouth 2 (two) times daily for 10 days. 20 tablet 0  . EPINEPHrine 0.3 mg/0.3 mL IJ SOAJ injection     . fluticasone (FLONASE) 50 MCG/ACT nasal spray Place 2 sprays  into both nostrils daily as needed for allergies or rhinitis. 16 Gray 2  . Immune Globulin, Human, (CUVITRU) 8 GM/40ML SOLN Inject into the skin.    . mometasone (ASMANEX, 60 METERED DOSES,) 220 MCG/INH inhaler Inhale 2 puffs into the lungs 2 (two) times daily as needed. 1 Inhaler 5  . NUVARING 0.12-0.015 MG/24HR vaginal ring Place 1 each vaginally every 28 (twenty-eight) days.   11  . omeprazole (PRILOSEC) 20 MG capsule Take by mouth.    . ondansetron (ZOFRAN) 4 MG tablet Take by mouth.    . SUMAtriptan (IMITREX) 25 MG tablet Take 1 tablet (25 mg total) by mouth every 12 (twelve) hours as needed for migraine or headache. May repeat in 2 hours if headache persists or recurs. (Patient not taking: Reported on 06/23/2019) 10 tablet 0   No current facility-administered medications for this visit.   Allergies: Allergies  Allergen Reactions  . Amoxicillin Nausea And Vomiting and Rash  . Amoxicillin-Pot  Clavulanate Nausea And Vomiting and Rash    Other reaction(s): Dizziness   I reviewed her past medical history, social history, family history, and environmental history and no significant changes have been reported from previous visits.  Review of Systems  Constitutional: Positive for chills, diaphoresis and fatigue. Negative for activity change, appetite change and fever.  HENT: Positive for sinus pressure. Negative for congestion, hearing loss, mouth sores, postnasal drip, rhinorrhea and sore throat.   Eyes: Negative for pain, discharge, redness and itching.  Respiratory: Negative for cough, choking, chest tightness, shortness of breath, wheezing and stridor.   Gastrointestinal: Negative for diarrhea, nausea and vomiting.  Endocrine: Negative for cold intolerance and heat intolerance.  Musculoskeletal: Negative for arthralgias, joint swelling and myalgias.  Skin: Negative for rash.  Allergic/Immunologic: Negative for environmental allergies and food allergies.    Objective:  Physical exam not obtained as encounter was done via telephone.   Previous notes and tests were reviewed.  I discussed the assessment and treatment plan with the patient. The patient was provided an opportunity to ask questions and all were answered. The patient agreed with the plan and demonstrated an understanding of the instructions.   The patient was advised to call back or seek an in-person evaluation if the symptoms worsen or if the condition fails to improve as anticipated.  I provided 22 minutes of non-face-to-face time during this encounter.  It was my pleasure to participate in [place] care today. Please feel free to contact me with any questions or concerns.   Sincerely,  Valentina Shaggy, MD

## 2019-10-06 NOTE — Telephone Encounter (Signed)
Talked to her this morning.  Malachi Bonds, MD [facility]

## 2019-10-07 ENCOUNTER — Telehealth: Payer: Self-pay | Admitting: *Deleted

## 2019-10-07 NOTE — Telephone Encounter (Signed)
L/m for patient to contact me next so I can advise when she gets new Ins she needs to reach out to advise so we can approval and possibly change pharmacy

## 2019-10-24 ENCOUNTER — Ambulatory Visit (HOSPITAL_COMMUNITY)
Admission: EM | Admit: 2019-10-24 | Discharge: 2019-10-24 | Disposition: A | Discharge status: Home / Self Care | Payer: Self-pay | Attending: Family Medicine | Admitting: Family Medicine

## 2019-10-24 ENCOUNTER — Ambulatory Visit (INDEPENDENT_AMBULATORY_CARE_PROVIDER_SITE_OTHER): Payer: Self-pay

## 2019-10-24 ENCOUNTER — Other Ambulatory Visit: Payer: Self-pay

## 2019-10-24 ENCOUNTER — Encounter (HOSPITAL_COMMUNITY): Payer: Self-pay

## 2019-10-24 DIAGNOSIS — M25551 Pain in right hip: Secondary | ICD-10-CM

## 2019-10-24 DIAGNOSIS — S39012A Strain of muscle, fascia and tendon of lower back, initial encounter: Secondary | ICD-10-CM

## 2019-10-24 DIAGNOSIS — G8911 Acute pain due to trauma: Secondary | ICD-10-CM
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
